# Patient Record
Sex: Female | Born: 1955
Health system: Southern US, Community
[De-identification: ages and names within clinical notes are randomized; demographics above are authoritative.]

## PROBLEM LIST (undated history)

## (undated) DIAGNOSIS — H269 Unspecified cataract: Secondary | ICD-10-CM

## (undated) DIAGNOSIS — F419 Anxiety disorder, unspecified: Secondary | ICD-10-CM

## (undated) DIAGNOSIS — R51 Headache: Secondary | ICD-10-CM

## (undated) DIAGNOSIS — L409 Psoriasis, unspecified: Secondary | ICD-10-CM

## (undated) DIAGNOSIS — R87629 Unspecified abnormal cytological findings in specimens from vagina: Secondary | ICD-10-CM

## (undated) DIAGNOSIS — M1990 Unspecified osteoarthritis, unspecified site: Secondary | ICD-10-CM

## (undated) DIAGNOSIS — E785 Hyperlipidemia, unspecified: Secondary | ICD-10-CM

## (undated) DIAGNOSIS — E7800 Pure hypercholesterolemia, unspecified: Secondary | ICD-10-CM

## (undated) DIAGNOSIS — C801 Malignant (primary) neoplasm, unspecified: Secondary | ICD-10-CM

## (undated) HISTORY — DX: Malignant (primary) neoplasm, unspecified: C80.1

## (undated) HISTORY — DX: Anxiety disorder, unspecified: F41.9

## (undated) HISTORY — DX: Psoriasis, unspecified: L40.9

## (undated) HISTORY — DX: Unspecified osteoarthritis, unspecified site: M19.90

## (undated) HISTORY — DX: Unspecified abnormal cytological findings in specimens from vagina: R87.629

## (undated) HISTORY — DX: Unspecified cataract: H26.9

## (undated) HISTORY — PX: ABDOMINAL HYSTERECTOMY: SHX81

---

## 2002-09-09 ENCOUNTER — Ambulatory Visit (HOSPITAL_COMMUNITY): Admission: RE | Admit: 2002-09-09 | Discharge: 2002-09-09 | Payer: Self-pay | Admitting: Internal Medicine

## 2002-09-09 ENCOUNTER — Encounter: Payer: Self-pay | Admitting: Obstetrics and Gynecology

## 2003-11-03 ENCOUNTER — Ambulatory Visit (HOSPITAL_COMMUNITY): Admission: RE | Admit: 2003-11-03 | Discharge: 2003-11-03 | Payer: Self-pay | Admitting: Family Medicine

## 2004-12-24 ENCOUNTER — Ambulatory Visit (HOSPITAL_COMMUNITY): Admission: RE | Admit: 2004-12-24 | Discharge: 2004-12-24 | Payer: Self-pay | Admitting: Family Medicine

## 2005-02-18 ENCOUNTER — Ambulatory Visit: Payer: Self-pay | Admitting: Internal Medicine

## 2005-02-18 ENCOUNTER — Encounter (INDEPENDENT_AMBULATORY_CARE_PROVIDER_SITE_OTHER): Payer: Self-pay | Admitting: Internal Medicine

## 2005-02-18 ENCOUNTER — Ambulatory Visit (HOSPITAL_COMMUNITY): Admission: RE | Admit: 2005-02-18 | Discharge: 2005-02-18 | Payer: Self-pay | Admitting: Internal Medicine

## 2005-10-03 ENCOUNTER — Ambulatory Visit (HOSPITAL_COMMUNITY): Admission: RE | Admit: 2005-10-03 | Discharge: 2005-10-03 | Payer: Self-pay | Admitting: Family Medicine

## 2006-03-13 ENCOUNTER — Ambulatory Visit (HOSPITAL_COMMUNITY): Admission: RE | Admit: 2006-03-13 | Discharge: 2006-03-13 | Payer: Self-pay | Admitting: Obstetrics & Gynecology

## 2007-10-11 ENCOUNTER — Ambulatory Visit (HOSPITAL_COMMUNITY): Admission: RE | Admit: 2007-10-11 | Discharge: 2007-10-11 | Payer: Self-pay | Admitting: Obstetrics & Gynecology

## 2007-12-08 ENCOUNTER — Emergency Department (HOSPITAL_COMMUNITY): Admission: EM | Admit: 2007-12-08 | Discharge: 2007-12-08 | Payer: Self-pay | Admitting: Emergency Medicine

## 2007-12-24 ENCOUNTER — Emergency Department (HOSPITAL_COMMUNITY): Admission: EM | Admit: 2007-12-24 | Discharge: 2007-12-24 | Payer: Self-pay | Admitting: Emergency Medicine

## 2008-12-23 ENCOUNTER — Ambulatory Visit (HOSPITAL_COMMUNITY): Admission: RE | Admit: 2008-12-23 | Discharge: 2008-12-23 | Payer: Self-pay | Admitting: Family Medicine

## 2010-03-16 ENCOUNTER — Ambulatory Visit (HOSPITAL_COMMUNITY): Admission: RE | Admit: 2010-03-16 | Discharge: 2010-03-16 | Payer: Self-pay | Admitting: Family Medicine

## 2010-05-16 ENCOUNTER — Encounter: Payer: Self-pay | Admitting: Obstetrics & Gynecology

## 2010-09-10 NOTE — Op Note (Signed)
[name], Joyce                ACCOUNT NO.:  [id number]   MEDICAL RECORD NO.:  [id number]          PATIENT TYPE:  AMB   LOCATION:  DAY                           FACILITY:  [facility]   PHYSICIAN:  Lionel December, M.D.    DATE OF BIRTH:  [birth year]   DATE OF PROCEDURE:  02/18/2005  DATE OF DISCHARGE:                                 OPERATIVE REPORT   PROCEDURE:  Colonoscopy.   INDICATIONS:  Joyce [name] is 55 year old Caucasian female who is here for  screening colonoscopy.  Family history is negative for colorectal carcinoma.   Procedures and risks were reviewed the patient, informed consent was  obtained.   PREOP MEDICATIONS:  Demerol 40 mg IV Versed 5 mg IV.   FINDINGS:  Procedure performed in endoscopy suite. The patient's vital signs  and O2 saturations were monitored during the procedure and remained stable.  The patient was placed left lateral position and rectal examination  performed. She had small central skin tags. Digital exam was normal. Olympus  videoscope was placed in rectum and advanced under vision into sigmoid colon  and beyond. Preparation was satisfactory. Scope was passed to the cecum  which was identified by appendiceal orifice and ileocecal valve. Pictures  taken for the record. A short segment of TI was also examined and was  normal.   As the scope was withdrawn colonic mucosa was carefully examined. There was  a small polyp in mid transverse colon which was ablated by cold biopsy.  Mucosa of the rest of the colon and rectum was normal. Scope was retroflexed  to examine anorectal junction; and she had small hemorrhoids below the  dentate line, and prominent anal papilla. Endoscope was straightened and  withdrawn. The patient tolerated the procedure well.   FINAL DIAGNOSIS:  1.  Small polyp ablated by cold biopsy from mid transverse colon.  2.  Small external hemorrhoids and prominent anal papilla.   RECOMMENDATIONS:  1.  Standard instructions given.  2.  I  will be contacting the patient with biopsy results and further      recommendations.      Lionel December, M.D.  Electronically Signed     NR/MEDQ  D:  02/18/2005  T:  02/18/2005  Job:  [id number]   cc:   Angus G. Renard Matter, MD  Fax: [phone]

## 2011-08-02 ENCOUNTER — Other Ambulatory Visit: Payer: Self-pay | Admitting: Obstetrics & Gynecology

## 2011-08-02 DIAGNOSIS — Z139 Encounter for screening, unspecified: Secondary | ICD-10-CM

## 2011-08-30 ENCOUNTER — Ambulatory Visit (HOSPITAL_COMMUNITY)
Admission: RE | Admit: 2011-08-30 | Discharge: 2011-08-30 | Disposition: A | Discharge status: Home / Self Care | Payer: BC Managed Care – PPO | Source: Ambulatory Visit | Attending: Obstetrics & Gynecology | Admitting: Obstetrics & Gynecology

## 2011-08-30 DIAGNOSIS — Z139 Encounter for screening, unspecified: Secondary | ICD-10-CM

## 2011-08-30 DIAGNOSIS — Z1231 Encounter for screening mammogram for malignant neoplasm of breast: Secondary | ICD-10-CM | POA: Insufficient documentation

## 2012-01-27 ENCOUNTER — Telehealth (INDEPENDENT_AMBULATORY_CARE_PROVIDER_SITE_OTHER): Payer: Self-pay | Admitting: *Deleted

## 2012-01-27 ENCOUNTER — Other Ambulatory Visit (INDEPENDENT_AMBULATORY_CARE_PROVIDER_SITE_OTHER): Payer: Self-pay | Admitting: *Deleted

## 2012-01-27 DIAGNOSIS — Z1211 Encounter for screening for malignant neoplasm of colon: Secondary | ICD-10-CM

## 2012-01-27 DIAGNOSIS — Z86010 Personal history of colonic polyps: Secondary | ICD-10-CM

## 2012-01-27 NOTE — Telephone Encounter (Signed)
Patient needs movi prep 

## 2012-01-30 MED ORDER — PEG-KCL-NACL-NASULF-NA ASC-C 100 G PO SOLR: 1.0000 | Freq: Once | ORAL | Status: DC

## 2012-03-14 ENCOUNTER — Telehealth (INDEPENDENT_AMBULATORY_CARE_PROVIDER_SITE_OTHER): Payer: Self-pay | Admitting: *Deleted

## 2012-03-14 NOTE — Telephone Encounter (Signed)
  Procedure: tcs  Reason/Indication:  Hx polyps  Has patient had this procedure before?  yes  If so, when, by whom and where?  2006  Is there a family history of colon cancer?  no  Who?  What age when diagnosed?    Is patient diabetic?   no      Does patient have prosthetic heart valve?  no  Do you have a pacemaker?  no  Has patient had joint replacement within last 12 months?  no  Is patient on Coumadin, Plavix and/or Aspirin? yes  Medications: asa 325 mg prn, celebrex 200 mg daily, crestor 20 mg daily, xanax 1 mg bid, otc sleep aid prn  Allergies: nkda  Medication Adjustment: asa 2 days  Procedure date & time: 04/05/12 at 930

## 2012-03-14 NOTE — Telephone Encounter (Signed)
agree

## 2012-03-26 ENCOUNTER — Encounter (HOSPITAL_COMMUNITY): Payer: Self-pay | Admitting: Pharmacy Technician

## 2012-04-05 ENCOUNTER — Ambulatory Visit (HOSPITAL_COMMUNITY)
Admission: RE | Admit: 2012-04-05 | Discharge: 2012-04-05 | Disposition: A | Discharge status: Home / Self Care | Payer: BC Managed Care – PPO | Source: Ambulatory Visit | Attending: Internal Medicine | Admitting: Internal Medicine

## 2012-04-05 ENCOUNTER — Encounter (HOSPITAL_COMMUNITY): Payer: Self-pay | Admitting: *Deleted

## 2012-04-05 ENCOUNTER — Encounter (HOSPITAL_COMMUNITY)
Admission: RE | Disposition: A | Discharge status: Home / Self Care | Payer: Self-pay | Source: Ambulatory Visit | Attending: Internal Medicine

## 2012-04-05 DIAGNOSIS — K644 Residual hemorrhoidal skin tags: Secondary | ICD-10-CM

## 2012-04-05 DIAGNOSIS — Z86010 Personal history of colonic polyps: Secondary | ICD-10-CM

## 2012-04-05 DIAGNOSIS — D126 Benign neoplasm of colon, unspecified: Secondary | ICD-10-CM | POA: Insufficient documentation

## 2012-04-05 DIAGNOSIS — D128 Benign neoplasm of rectum: Secondary | ICD-10-CM

## 2012-04-05 DIAGNOSIS — K6389 Other specified diseases of intestine: Secondary | ICD-10-CM

## 2012-04-05 DIAGNOSIS — D129 Benign neoplasm of anus and anal canal: Secondary | ICD-10-CM

## 2012-04-05 HISTORY — DX: Pure hypercholesterolemia, unspecified: E78.00

## 2012-04-05 HISTORY — DX: Headache: R51

## 2012-04-05 HISTORY — PX: COLONOSCOPY: SHX5424

## 2012-04-05 SURGERY — COLONOSCOPY
Anesthesia: Moderate Sedation

## 2012-04-05 MED ORDER — SODIUM CHLORIDE 0.45 % IV SOLN
INTRAVENOUS | Status: DC
  Administered 2012-04-05: 09:00:00 via INTRAVENOUS

## 2012-04-05 MED ORDER — MIDAZOLAM HCL 5 MG/5ML IJ SOLN
INTRAMUSCULAR | Status: DC | PRN
  Administered 2012-04-05 (×3): 2 mg via INTRAVENOUS

## 2012-04-05 MED ORDER — MEPERIDINE HCL 50 MG/ML IJ SOLN
INTRAMUSCULAR | Status: DC | PRN
  Administered 2012-04-05: 25 mg via INTRAVENOUS

## 2012-04-05 MED ORDER — MEPERIDINE HCL 50 MG/ML IJ SOLN
INTRAMUSCULAR | Status: AC
  Filled 2012-04-05: qty 1

## 2012-04-05 MED ORDER — MIDAZOLAM HCL 5 MG/5ML IJ SOLN
INTRAMUSCULAR | Status: AC
  Filled 2012-04-05: qty 10

## 2012-04-05 MED ORDER — STERILE WATER FOR IRRIGATION IR SOLN
Status: DC | PRN
  Administered 2012-04-05: 09:00:00

## 2012-04-05 MED ORDER — NYSTATIN-TRIAMCINOLONE 100000-0.1 UNIT/GM-% EX OINT: TOPICAL_OINTMENT | Freq: Two times a day (BID) | CUTANEOUS | Status: DC

## 2012-04-05 NOTE — H&P (Signed)
Joyce [name] is an 56 y.o. female.   Chief Complaint: Patient is here for colonoscopy. HPI: Patient is 56 year old Caucasian female with history of colonic adenoma and is in for surveillance examination. Her last exam was in October 2006 with a single small tubular adenoma and she was advised to return in 7 years instead of 5. She is feeling fine. She denies abdominal pain change in her bowel habits or rectal bleeding. Family history is negative for CRC to  Past Medical History  Diagnosis Date  . Hypercholesteremia   . Headache     Past Surgical History  Procedure Date  . Abdominal hysterectomy     Family History  Problem Relation Age of Onset  . Colon cancer Neg Hx    Social History:  reports that she has been smoking.  She does not have any smokeless tobacco history on file. She reports that she does not drink alcohol or use illicit drugs.  Allergies: No Known Allergies  Medications Prior to Admission  Medication Sig Dispense Refill  . ALPRAZolam (XANAX) 1 MG tablet Take 1 mg by mouth daily.      Marland Kitchen aspirin EC 81 MG tablet Take 81 mg by mouth daily.      . celecoxib (CELEBREX) 200 MG capsule Take 200 mg by mouth daily.      . peg 3350 powder (MOVIPREP) 100 G SOLR Take 1 kit (100 g total) by mouth once.  1 kit  0  . rosuvastatin (CRESTOR) 20 MG tablet Take 20 mg by mouth daily.        No results found for this or any previous visit (from the past 48 hour(s)). No results found.  ROS  Blood pressure 117/81, pulse 74, temperature 97.7 F (36.5 C), temperature source Oral, resp. rate 18, height 5' 2.5" (1.588 m), weight 138 lb (62.596 kg), SpO2 97.00%. Physical Exam  Constitutional: She appears well-developed and well-nourished.  HENT:  Mouth/Throat: Oropharynx is clear and moist.  Eyes: Conjunctivae normal are normal. No scleral icterus.  Neck: No thyromegaly present.  Cardiovascular: Normal rate, regular rhythm and normal heart sounds.   No murmur heard. Respiratory:  Effort normal and breath sounds normal.  GI: Soft. She exhibits no distension. There is no tenderness.  Musculoskeletal: She exhibits no edema.  Lymphadenopathy:    She has no cervical adenopathy.  Neurological: She is alert.  Skin: Skin is warm and dry.     Assessment/Plan History of colonic adenoma. Surveillance colonoscopy.   Joyce [name],Joyce [name] 04/05/2012, 9:28 AM

## 2012-04-05 NOTE — OR Nursing (Signed)
Gave 25mg  of demerol at 0952 by t Amato Sevillano

## 2012-04-05 NOTE — Op Note (Signed)
COLONOSCOPY PROCEDURE REPORT  PATIENT:  Joyce [name] [facility]  MR#:  [id number] Birthdate:  [birth year], 56 y.o., female Endoscopist:  Dr. Malissa Hippo, MD Referred By:  Dr. Alice Reichert, MD Procedure Date: 04/05/2012  Procedure:   Colonoscopy with snare polypectomy.  Indications: Patient is 56 years old Caucasian female with colonic adenoma removed 7 years ago. She is here for surveillance examination. She does not have any GI symptoms other than intermittent constipation.  Informed Consent:  The procedure and risks were reviewed with the patient and informed consent was obtained.  Medications:  Demerol 50 mg IV Versed 6 mg IV  Description of procedure:  After a digital rectal exam was performed, that colonoscope was advanced from the anus through the rectum and colon to the area of the cecum, ileocecal valve and appendiceal orifice. The cecum was deeply intubated. These structures were well-seen and photographed for the record. From the level of the cecum and ileocecal valve, the scope was slowly and cautiously withdrawn. The mucosal surfaces were carefully surveyed utilizing scope tip to flexion to facilitate fold flattening as needed. The scope was pulled down into the rectum where a thorough exam including retroflexion was performed.  Findings:   Prep satisfactory. 2 small polyps ablated via cold biopsy and submitted together. These are located at cecum and hepatic flexure. 10 mm worm like sessile polyp it from mid transverse colon. Another small polyp ablated via cold biopsy from rectum. Hemorrhoids below the dentate line along with 2 anal papillae.  Therapeutic/Diagnostic Maneuvers Performed:  See above  Complications:  None  Cecal Withdrawal Time:  23 minutes  Impression:  Examination performed to cecum. 2 small polyps are ablated via cold biopsy and submitted together(cecum and hepatic flexure). 10 mm sessile wormlike polyp snared from transverse colon. Residual polyp  coagulated with snare tip. Small rectal polyp ablated via cold biopsy. External hemorrhoids and anal papillae.   Recommendations:  Standard instructions given. Mycolog-II cream to be applied to anal canal twice a day for 2 weeks. I will contact patient with biopsy results.  REHMAN,NAJEEB U  04/05/2012 10:18 AM  CC: Dr. Alice Reichert, MD & Dr. Bonnetta Barry ref. provider found

## 2012-04-09 ENCOUNTER — Encounter (HOSPITAL_COMMUNITY): Payer: Self-pay | Admitting: Internal Medicine

## 2012-04-09 ENCOUNTER — Encounter (INDEPENDENT_AMBULATORY_CARE_PROVIDER_SITE_OTHER): Payer: Self-pay | Admitting: *Deleted

## 2012-11-19 ENCOUNTER — Other Ambulatory Visit: Payer: Self-pay | Admitting: Obstetrics & Gynecology

## 2012-11-19 DIAGNOSIS — Z139 Encounter for screening, unspecified: Secondary | ICD-10-CM

## 2012-12-10 ENCOUNTER — Ambulatory Visit (HOSPITAL_COMMUNITY)
Admission: RE | Admit: 2012-12-10 | Discharge: 2012-12-10 | Disposition: A | Discharge status: Home / Self Care | Payer: BC Managed Care – PPO | Source: Ambulatory Visit | Attending: Obstetrics & Gynecology | Admitting: Obstetrics & Gynecology

## 2012-12-10 ENCOUNTER — Ambulatory Visit (HOSPITAL_COMMUNITY): Payer: BC Managed Care – PPO

## 2012-12-10 DIAGNOSIS — Z1231 Encounter for screening mammogram for malignant neoplasm of breast: Secondary | ICD-10-CM | POA: Insufficient documentation

## 2012-12-10 DIAGNOSIS — Z139 Encounter for screening, unspecified: Secondary | ICD-10-CM

## 2012-12-12 ENCOUNTER — Ambulatory Visit (INDEPENDENT_AMBULATORY_CARE_PROVIDER_SITE_OTHER): Payer: BC Managed Care – PPO | Admitting: Obstetrics & Gynecology

## 2012-12-12 ENCOUNTER — Other Ambulatory Visit (HOSPITAL_COMMUNITY)
Admission: RE | Admit: 2012-12-12 | Discharge: 2012-12-12 | Disposition: A | Discharge status: Home / Self Care | Payer: BC Managed Care – PPO | Source: Ambulatory Visit | Attending: Obstetrics & Gynecology | Admitting: Obstetrics & Gynecology

## 2012-12-12 ENCOUNTER — Encounter: Payer: Self-pay | Admitting: Obstetrics & Gynecology

## 2012-12-12 DIAGNOSIS — Z01419 Encounter for gynecological examination (general) (routine) without abnormal findings: Secondary | ICD-10-CM

## 2012-12-12 DIAGNOSIS — E7800 Pure hypercholesterolemia, unspecified: Secondary | ICD-10-CM

## 2012-12-12 DIAGNOSIS — F419 Anxiety disorder, unspecified: Secondary | ICD-10-CM

## 2012-12-12 DIAGNOSIS — Z1151 Encounter for screening for human papillomavirus (HPV): Secondary | ICD-10-CM | POA: Insufficient documentation

## 2012-12-12 DIAGNOSIS — Z1212 Encounter for screening for malignant neoplasm of rectum: Secondary | ICD-10-CM

## 2012-12-12 NOTE — Addendum Note (Signed)
Addended by: Criss Alvine on: 12/12/2012 04:20 PM   Modules accepted: Orders

## 2012-12-12 NOTE — Progress Notes (Signed)
Patient ID: Aleyda Gindlesperger [facility], female   DOB: Oct 24, 1955, 57 y.o.   MRN: [id number] Subjective:     Joyce [name] is a 57 y.o. female here for a routine exam.  No LMP recorded. Patient has had a hysterectomy.was done because of cancer No obstetric history on file. Current complaints: none.  Personal health questionnaire reviewed: yes.   Gynecologic History No LMP recorded. Patient has had a hysterectomy. Contraception: status post hysterectomy Last Pap: 2013. Results were: normal Last mammogram: 2014. Results were: normal  Obstetric History OB History  No data available    Past Surgical History  Procedure Laterality Date  . Abdominal hysterectomy    . Colonoscopy  04/05/2012    Procedure: COLONOSCOPY;  Surgeon: Malissa Hippo, MD;  Location: [facility];  Service: Endoscopy;  Laterality: N/A;  930    The following portions of the patient's history were reviewed and updated as appropriate: allergies, current medications, past family history, past medical history, past social history, past surgical history and problem list.  Review of Systems  Review of Systems  Constitutional: Negative for fever, chills, weight loss, malaise/fatigue and diaphoresis.  HENT: Negative for hearing loss, ear pain, nosebleeds, congestion, sore throat, neck pain, tinnitus and ear discharge.   Eyes: Negative for blurred vision, double vision, photophobia, pain, discharge and redness.  Respiratory: Negative for cough, hemoptysis, sputum production, shortness of breath, wheezing and stridor.   Cardiovascular: Negative for chest pain, palpitations, orthopnea, claudication, leg swelling and PND.  Gastrointestinal: negative for abdominal pain. Negative for heartburn, nausea, vomiting, diarrhea, constipation, blood in stool and melena.  Genitourinary: Negative for dysuria, urgency, frequency, hematuria and flank pain.  Musculoskeletal: Negative for myalgias, back pain, joint pain and falls.  Skin: Negative for  itching and rash.  Neurological: Negative for dizziness, tingling, tremors, sensory change, speech change, focal weakness, seizures, loss of consciousness, weakness and headaches.  Endo/Heme/Allergies: Negative for environmental allergies and polydipsia. Does not bruise/bleed easily.  Psychiatric/Behavioral: Negative for depression, suicidal ideas, hallucinations, memory loss and substance abuse. The patient is not nervous/anxious and does not have insomnia.        Objective:    Physical Exam  Vitals reviewed. Constitutional: She is oriented to person, place, and time. She appears well-developed and well-nourished.  HENT:  Head: Normocephalic and atraumatic.        Right Ear: External ear normal.  Left Ear: External ear normal.  Nose: Nose normal.  Mouth/Throat: Oropharynx is clear and moist.  Eyes: Conjunctivae and EOM are normal. Pupils are equal, round, and reactive to light. Right eye exhibits no discharge. Left eye exhibits no discharge. No scleral icterus.  Neck: Normal range of motion. Neck supple. No tracheal deviation present. No thyromegaly present.  Cardiovascular: Normal rate, regular rhythm, normal heart sounds and intact distal pulses.  Exam reveals no gallop and no friction rub.   No murmur heard. Respiratory: Effort normal and breath sounds normal. No respiratory distress. She has no wheezes. She has no rales. She exhibits no tenderness.  Breasts no masses discharge or skin changes GI: Soft. Bowel sounds are normal. She exhibits no distension and no mass. There is no tenderness. There is no rebound and no guarding.  Genitourinary:       Vulva is normal without lesions Vagina is pink moist without discharge Cervix normal in appearance and pap is done Uterus is normal size shape and contour Adnexa is negative with normal sized ovaries Rectal heme occult negative no masses normal tone  Musculoskeletal: Normal range of motion. She exhibits no edema and no tenderness.   Neurological: She is alert and oriented to person, place, and time. She has normal reflexes. She displays normal reflexes. No cranial nerve deficit. She exhibits normal muscle tone. Coordination normal.  Skin: Skin is warm and dry. No rash noted. No erythema. No pallor.  Psychiatric: She has a normal mood and affect. Her behavior is normal. Judgment and thought content normal.       Assessment:    Healthy female exam.    Plan:    Mammogram ordered. Follow up in: 1 year.

## 2012-12-13 ENCOUNTER — Telehealth: Payer: Self-pay | Admitting: Obstetrics and Gynecology

## 2013-09-27 ENCOUNTER — Encounter: Payer: Self-pay | Admitting: Family Medicine

## 2013-09-27 ENCOUNTER — Encounter (INDEPENDENT_AMBULATORY_CARE_PROVIDER_SITE_OTHER): Payer: Self-pay

## 2013-09-27 ENCOUNTER — Ambulatory Visit (INDEPENDENT_AMBULATORY_CARE_PROVIDER_SITE_OTHER): Payer: BC Managed Care – PPO | Admitting: Family Medicine

## 2013-09-27 VITALS — BP 134/90 | HR 84 | Temp 98.2°F | Ht 61.0 in | Wt 151.0 lb

## 2013-09-27 DIAGNOSIS — H60399 Other infective otitis externa, unspecified ear: Secondary | ICD-10-CM

## 2013-09-27 MED ORDER — NEOMYCIN-POLYMYXIN-HC 3.5-10000-1 OT SOLN: 3.0000 [drp] | Freq: Four times a day (QID) | OTIC | Status: DC

## 2013-09-27 NOTE — Progress Notes (Signed)
   Subjective:    Patient ID: Joyce [name] [facility], female    DOB: 1955/08/29, 58 y.o.   MRN: [id number]  HPI This 58 y.o. female presents for evaluation of right ear discomfort for a week.  She was sent over from her employer for evaluation.  She has decreased hearing acuity in her right ear.   Review of Systems C/o right ear discomfort   No chest pain, SOB, HA, dizziness, vision change, N/V, diarrhea, constipation, dysuria, urinary urgency or frequency, myalgias, arthralgias or rash.  Objective:   Physical Exam  Vital signs noted  Well developed well nourished female.  HEENT - Head atraumatic Normocephalic                Eyes - PERRLA, Conjuctiva - clear Sclera- Clear EOMI                Ears - EAC right side impacted and after irrigation EAC's Wnl TM's Wnl Gross Hearing WNL                Nose - Nares patent                 Throat - oropharanx wnl Respiratory - Lungs CTA bilateral Cardiac - RRR S1 and S2 without murmur GI - Abdomen soft Nontender and bowel sounds active x 4 Extremities - No edema. Neuro - Grossly intact.      Assessment & Plan:  Otitis, externa, infective - Plan: neomycin-polymyxin-hydrocortisone (CORTISPORIN) otic solution  Cerumen impaction - Ears irrigated to clear Ok to return back to work.

## 2013-10-03 IMAGING — MG MM DIGITAL SCREENING
4 series · 4 of 4 positions shown · non-contrast
Comparison: Previous exam(s)

ACR Breast Density Category a: The breast tissue is almost entirely
fatty.

CLINICAL DATA: Screening.

EXAM:
DIGITAL SCREENING BILATERAL MAMMOGRAM WITH CAD

[L CC]
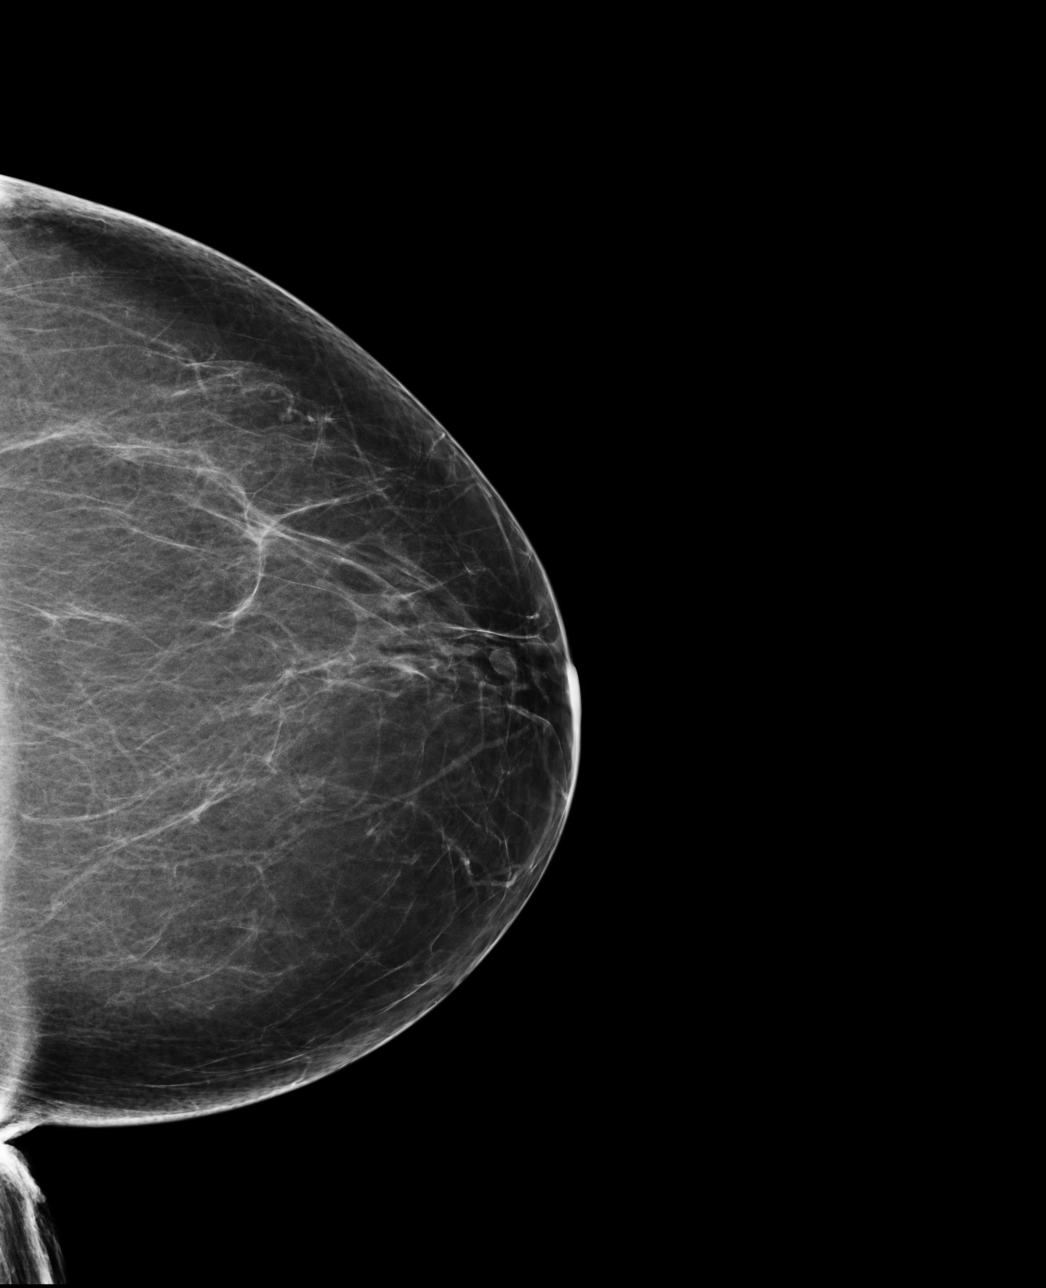

[L MLO]
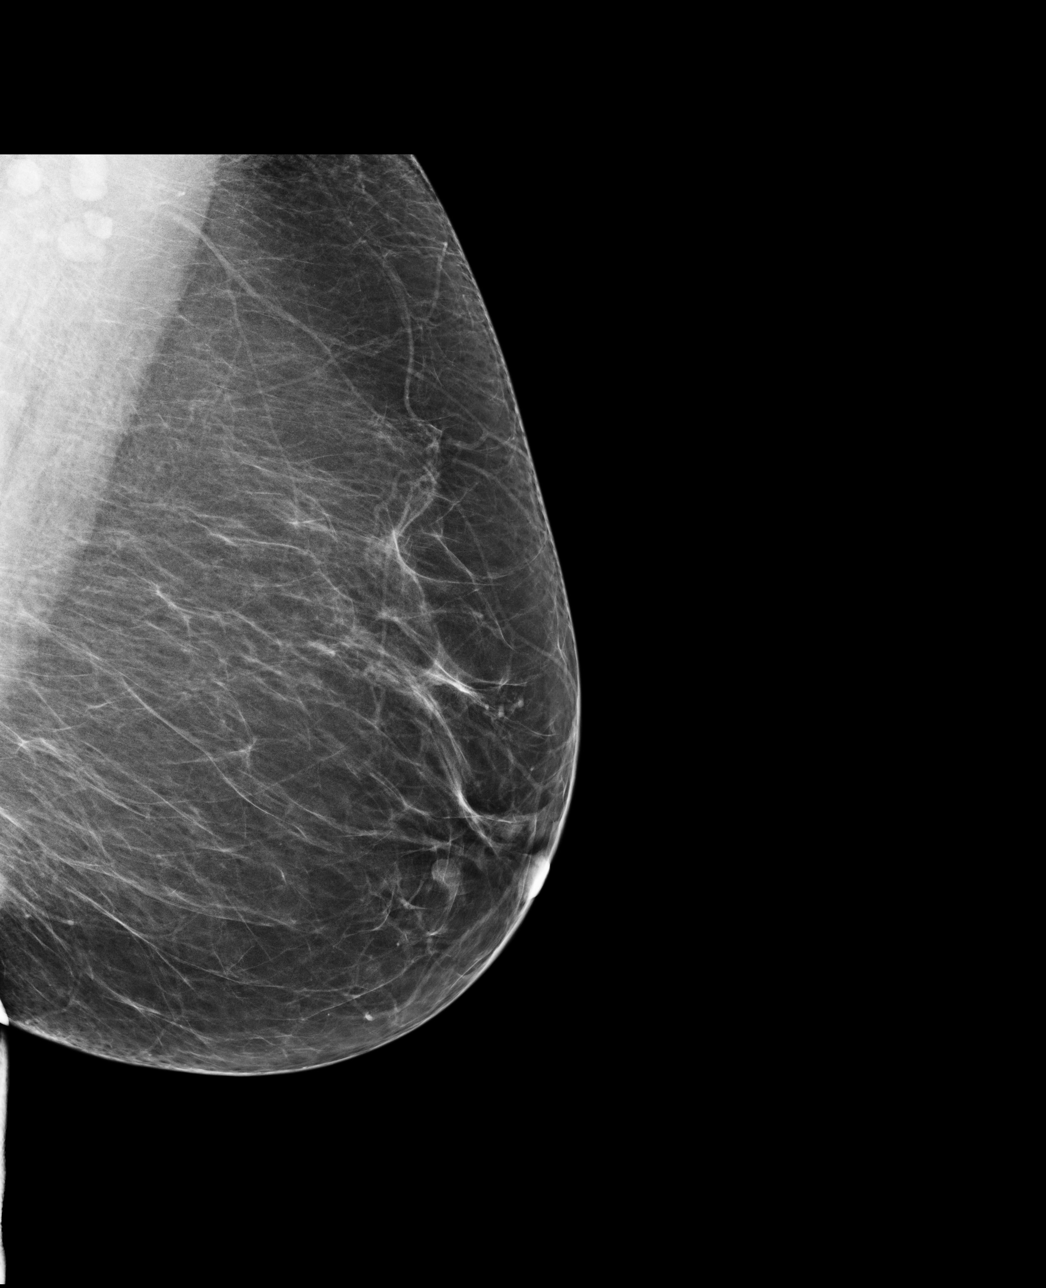

[R CC]
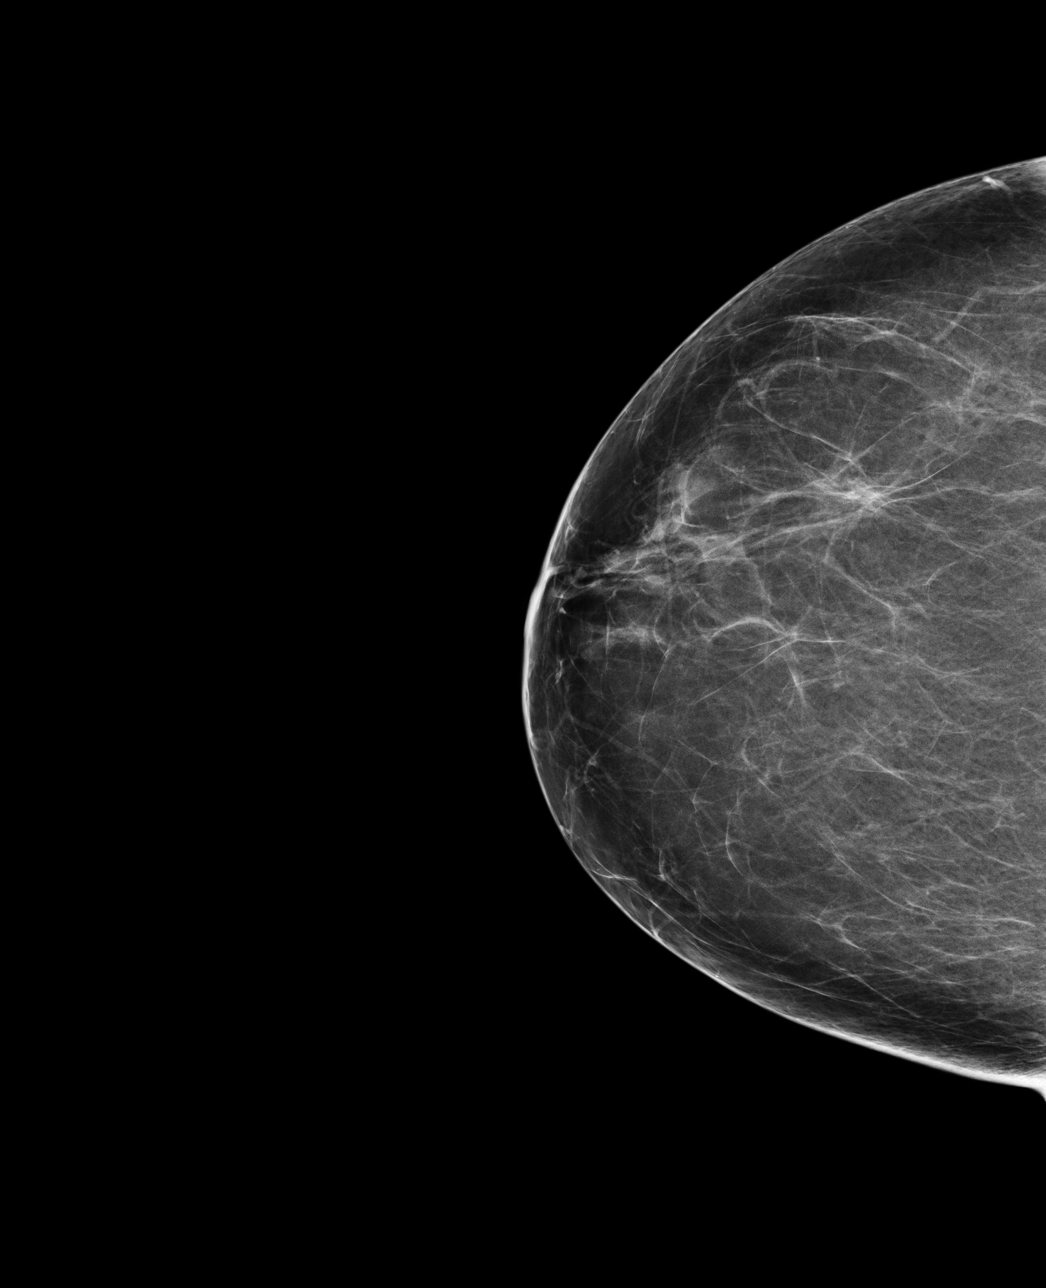

[R MLO]
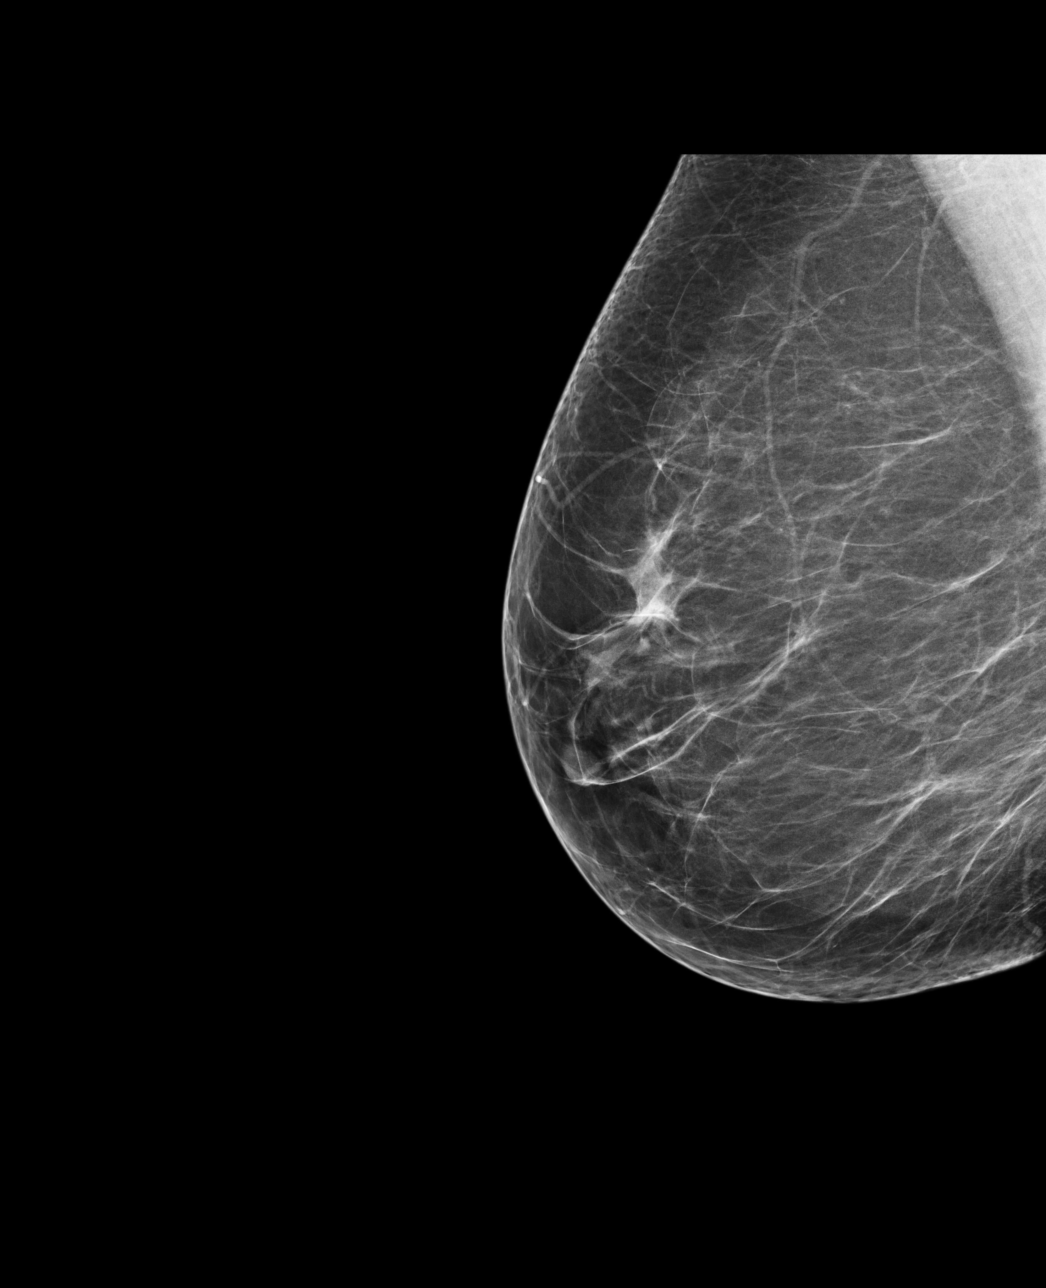

[4 of 4 positions shown; findings below may reference images not displayed]

FINDINGS: There are no findings suspicious for malignancy. Images were
processed with CAD.
IMPRESSION: No mammographic evidence of malignancy. A result letter of this
screening mammogram will be mailed directly to the patient.

BI-RADS CATEGORY  1. Negative

RECOMMENDATION:
Screening mammogram in one year. (Code:GD-M-W2P)

## 2014-01-23 ENCOUNTER — Other Ambulatory Visit: Payer: Self-pay | Admitting: Obstetrics & Gynecology

## 2014-01-23 DIAGNOSIS — Z1239 Encounter for other screening for malignant neoplasm of breast: Secondary | ICD-10-CM

## 2014-01-28 ENCOUNTER — Other Ambulatory Visit: Payer: Self-pay | Admitting: Obstetrics & Gynecology

## 2014-01-28 DIAGNOSIS — Z1231 Encounter for screening mammogram for malignant neoplasm of breast: Secondary | ICD-10-CM

## 2014-02-24 ENCOUNTER — Other Ambulatory Visit (HOSPITAL_COMMUNITY)
Admission: RE | Admit: 2014-02-24 | Discharge: 2014-02-24 | Disposition: A | Discharge status: Home / Self Care | Payer: BC Managed Care – PPO | Source: Ambulatory Visit | Attending: Obstetrics & Gynecology | Admitting: Obstetrics & Gynecology

## 2014-02-24 ENCOUNTER — Encounter: Payer: Self-pay | Admitting: Obstetrics & Gynecology

## 2014-02-24 ENCOUNTER — Ambulatory Visit (INDEPENDENT_AMBULATORY_CARE_PROVIDER_SITE_OTHER): Payer: BC Managed Care – PPO | Admitting: Obstetrics & Gynecology

## 2014-02-24 ENCOUNTER — Ambulatory Visit (HOSPITAL_COMMUNITY)
Admission: RE | Admit: 2014-02-24 | Discharge: 2014-02-24 | Disposition: A | Discharge status: Home / Self Care | Payer: BC Managed Care – PPO | Source: Ambulatory Visit | Attending: Obstetrics & Gynecology | Admitting: Obstetrics & Gynecology

## 2014-02-24 VITALS — BP 120/90 | Ht 62.0 in | Wt 150.0 lb

## 2014-02-24 DIAGNOSIS — Z01419 Encounter for gynecological examination (general) (routine) without abnormal findings: Secondary | ICD-10-CM | POA: Insufficient documentation

## 2014-02-24 DIAGNOSIS — Z1211 Encounter for screening for malignant neoplasm of colon: Secondary | ICD-10-CM

## 2014-02-24 DIAGNOSIS — Z1231 Encounter for screening mammogram for malignant neoplasm of breast: Secondary | ICD-10-CM | POA: Diagnosis present

## 2014-02-24 DIAGNOSIS — Z1212 Encounter for screening for malignant neoplasm of rectum: Secondary | ICD-10-CM

## 2014-02-24 NOTE — Progress Notes (Signed)
Patient ID: Laiyah Exline [facility], female   DOB: 05/08/1955, 58 y.o.   MRN: [id number] Subjective:     GENESIA CASLIN is a 58 y.o. female here for a routine exam.  No LMP recorded. Patient has had a hysterectomy. No obstetric history on file. Birth Control Method:  na Menstrual Calendar(currently): na  Current complaints: some bladder symptoms.   Current acute medical issues:  none   Recent Gynecologic History No LMP recorded. Patient has had a hysterectomy.  Uterine cancer Last Pap: 2014,  normal Last mammogram: today,  pending  Past Medical History  Diagnosis Date  . Hypercholesteremia   . YIFOYDXA(128.7)     Past Surgical History  Procedure Laterality Date  . Abdominal hysterectomy    . Colonoscopy  04/05/2012    Procedure: COLONOSCOPY;  Surgeon: Rogene Houston, MD;  Location: [facility];  Service: Endoscopy;  Laterality: N/A;  930    OB History    No data available      History   Social History  . Marital Status: Single    Spouse Name: N/A    Number of Children: N/A  . Years of Education: N/A   Social History Main Topics  . Smoking status: Current Every Day Smoker -- 1.00 packs/day for 44 years  . Smokeless tobacco: None  . Alcohol Use: No  . Drug Use: No  . Sexual Activity: Yes   Other Topics Concern  . None   Social History Narrative    Family History  Problem Relation Age of Onset  . Colon cancer Neg Hx   . Ovarian cancer Mother   . Bladder Cancer Father   . Lung cancer Sister     Current outpatient prescriptions: ALPRAZolam (XANAX) 1 MG tablet, Take 1 mg by mouth daily., Disp: , Rfl: ;  celecoxib (CELEBREX) 200 MG capsule, Take 200 mg by mouth daily., Disp: , Rfl: ;  neomycin-polymyxin-hydrocortisone (CORTISPORIN) otic solution, Place 3 drops into the right ear 4 (four) times daily., Disp: 10 mL, Rfl: 0;  rosuvastatin (CRESTOR) 20 MG tablet, Take 20 mg by mouth daily., Disp: , Rfl:   Review of Systems  Review of Systems  Constitutional: Negative  for fever, chills, weight loss, malaise/fatigue and diaphoresis.  HENT: Negative for hearing loss, ear pain, nosebleeds, congestion, sore throat, neck pain, tinnitus and ear discharge.   Eyes: Negative for blurred vision, double vision, photophobia, pain, discharge and redness.  Respiratory: Negative for cough, hemoptysis, sputum production, shortness of breath, wheezing and stridor.   Cardiovascular: Negative for chest pain, palpitations, orthopnea, claudication, leg swelling and PND.  Gastrointestinal: negative for abdominal pain. Negative for heartburn, nausea, vomiting, diarrhea, constipation, blood in stool and melena.  Genitourinary: Negative for dysuria, urgency, frequency, hematuria and flank pain.  Musculoskeletal: Negative for myalgias, back pain, joint pain and falls.  Skin: Negative for itching and rash.  Neurological: Negative for dizziness, tingling, tremors, sensory change, speech change, focal weakness, seizures, loss of consciousness, weakness and headaches.  Endo/Heme/Allergies: Negative for environmental allergies and polydipsia. Does not bruise/bleed easily.  Psychiatric/Behavioral: Negative for depression, suicidal ideas, hallucinations, memory loss and substance abuse. The patient is not nervous/anxious and does not have insomnia.        Objective:  Blood pressure 120/90, height 5\' 2"  (1.575 m), weight 150 lb (68.04 kg).   Physical Exam  Vitals reviewed. Constitutional: She is oriented to person, place, and time. She appears well-developed and well-nourished.  HENT:  Head: Normocephalic and atraumatic.  Right Ear: External ear normal.  Left Ear: External ear normal.  Nose: Nose normal.  Mouth/Throat: Oropharynx is clear and moist.  Eyes: Conjunctivae and EOM are normal. Pupils are equal, round, and reactive to light. Right eye exhibits no discharge. Left eye exhibits no discharge. No scleral icterus.  Neck: Normal range of motion. Neck supple. No tracheal  deviation present. No thyromegaly present.  Cardiovascular: Normal rate, regular rhythm, normal heart sounds and intact distal pulses.  Exam reveals no gallop and no friction rub.   No murmur heard. Respiratory: Effort normal and breath sounds normal. No respiratory distress. She has no wheezes. She has no rales. She exhibits no tenderness.  GI: Soft. Bowel sounds are normal. She exhibits no distension and no mass. There is no tenderness. There is no rebound and no guarding.  Genitourinary:  Breasts no masses skin changes or nipple changes bilaterally      Vulva is normal without lesions Vagina is pink moist without discharge Cervix absent  Pap done Uterus is absent Adnexa is negative  Rectal    hemoccult negative, normal tone, no masses  Musculoskeletal: Normal range of motion. She exhibits no edema and no tenderness.  Neurological: She is alert and oriented to person, place, and time. She has normal reflexes. She displays normal reflexes. No cranial nerve deficit. She exhibits normal muscle tone. Coordination normal.  Skin: Skin is warm and dry. No rash noted. No erythema. No pallor.  Psychiatric: She has a normal mood and affect. Her behavior is normal. Judgment and thought content normal.       Assessment:    Healthy female exam.    Plan:    Mammogram ordered. Follow up in: 1 year.

## 2014-02-27 LAB — CYTOLOGY - PAP

## 2014-08-15 ENCOUNTER — Ambulatory Visit (HOSPITAL_COMMUNITY)
Admission: RE | Admit: 2014-08-15 | Discharge: 2014-08-15 | Disposition: A | Discharge status: Home / Self Care | Payer: BLUE CROSS/BLUE SHIELD | Source: Ambulatory Visit | Attending: Family Medicine | Admitting: Family Medicine

## 2014-08-15 ENCOUNTER — Other Ambulatory Visit (HOSPITAL_COMMUNITY): Payer: Self-pay | Admitting: Family Medicine

## 2014-08-15 DIAGNOSIS — M542 Cervicalgia: Secondary | ICD-10-CM

## 2015-03-30 ENCOUNTER — Other Ambulatory Visit: Payer: Self-pay | Admitting: Obstetrics & Gynecology

## 2015-03-30 DIAGNOSIS — Z1231 Encounter for screening mammogram for malignant neoplasm of breast: Secondary | ICD-10-CM

## 2015-04-16 ENCOUNTER — Ambulatory Visit (HOSPITAL_COMMUNITY)
Admission: RE | Admit: 2015-04-16 | Discharge: 2015-04-16 | Disposition: A | Discharge status: Home / Self Care | Payer: BLUE CROSS/BLUE SHIELD | Source: Ambulatory Visit | Attending: Obstetrics & Gynecology | Admitting: Obstetrics & Gynecology

## 2015-04-16 ENCOUNTER — Other Ambulatory Visit (HOSPITAL_COMMUNITY)
Admission: RE | Admit: 2015-04-16 | Discharge: 2015-04-16 | Disposition: A | Discharge status: Home / Self Care | Payer: BLUE CROSS/BLUE SHIELD | Source: Ambulatory Visit | Attending: Obstetrics & Gynecology | Admitting: Obstetrics & Gynecology

## 2015-04-16 ENCOUNTER — Encounter: Payer: Self-pay | Admitting: Obstetrics & Gynecology

## 2015-04-16 ENCOUNTER — Ambulatory Visit (INDEPENDENT_AMBULATORY_CARE_PROVIDER_SITE_OTHER): Payer: BLUE CROSS/BLUE SHIELD | Admitting: Obstetrics & Gynecology

## 2015-04-16 VITALS — BP 140/90 | HR 76 | Ht 62.0 in | Wt 167.3 lb

## 2015-04-16 DIAGNOSIS — Z1212 Encounter for screening for malignant neoplasm of rectum: Secondary | ICD-10-CM

## 2015-04-16 DIAGNOSIS — Z01419 Encounter for gynecological examination (general) (routine) without abnormal findings: Secondary | ICD-10-CM | POA: Diagnosis not present

## 2015-04-16 DIAGNOSIS — Z1231 Encounter for screening mammogram for malignant neoplasm of breast: Secondary | ICD-10-CM | POA: Diagnosis present

## 2015-04-16 DIAGNOSIS — Z1211 Encounter for screening for malignant neoplasm of colon: Secondary | ICD-10-CM

## 2015-04-16 NOTE — Addendum Note (Signed)
Addended by: Diona Fanti A on: 04/16/2015 09:51 AM   Modules accepted: Orders

## 2015-04-16 NOTE — Progress Notes (Signed)
Patient ID: Arthurine Ambrosio [facility], female   DOB: 1955-07-22, 59 y.o.   MRN: [id number] Subjective:     Joyce [name] is a 59 y.o. female here for a routine exam.  No LMP recorded. Patient has had a hysterectomy. No obstetric history on file. Birth Control Method:  hysterectomy Menstrual Calendar(currently): hysterectomy  Current complaints: none weight from smoking cessation.   Current acute medical issues:  none   Recent Gynecologic History No LMP recorded. Patient has had a hysterectomy. Last Pap: 2015,  normal Last mammogram: today,  normal  Past Medical History  Diagnosis Date  . Hypercholesteremia   . Headache(784.0)   . Cancer [facility])     Past Surgical History  Procedure Laterality Date  . Abdominal hysterectomy    . Colonoscopy  04/05/2012    Procedure: COLONOSCOPY;  Surgeon: Rogene Houston, MD;  Location[facility];  Service: Endoscopy;  Laterality: N/A;  930    OB History    No data available      Social History   Social History  . Marital Status: Single    Spouse Name: N/A  . Number of Children: N/A  . Years of Education: N/A   Social History Main Topics  . Smoking status: Former Smoker -- 1.00 packs/day for 44 years  . Smokeless tobacco: None  . Alcohol Use: No  . Drug Use: No  . Sexual Activity: Yes   Other Topics Concern  . None   Social History Narrative    Family History  Problem Relation Age of Onset  . Colon cancer Neg Hx   . Ovarian cancer Mother   . Bladder Cancer Father   . Lung cancer Sister      Current outpatient prescriptions:  .  ALPRAZolam (XANAX) 1 MG tablet, Take 1 mg by mouth daily., Disp: , Rfl:  .  celecoxib (CELEBREX) 200 MG capsule, Take 200 mg by mouth daily., Disp: , Rfl:  .  neomycin-polymyxin-hydrocortisone (CORTISPORIN) otic solution, Place 3 drops into the right ear 4 (four) times daily., Disp: 10 mL, Rfl: 0 .  rosuvastatin (CRESTOR) 20 MG tablet, Take 20 mg by mouth daily., Disp: , Rfl:   Review of  Systems  Review of Systems  Constitutional: Negative for fever, chills, weight loss, malaise/fatigue and diaphoresis.  HENT: Negative for hearing loss, ear pain, nosebleeds, congestion, sore throat, neck pain, tinnitus and ear discharge.   Eyes: Negative for blurred vision, double vision, photophobia, pain, discharge and redness.  Respiratory: Negative for cough, hemoptysis, sputum production, shortness of breath, wheezing and stridor.   Cardiovascular: Negative for chest pain, palpitations, orthopnea, claudication, leg swelling and PND.  Gastrointestinal: negative for abdominal pain. Negative for heartburn, nausea, vomiting, diarrhea, constipation, blood in stool and melena.  Genitourinary: Negative for dysuria, urgency, frequency, hematuria and flank pain.  Musculoskeletal: Negative for myalgias, back pain, joint pain and falls.  Skin: Negative for itching and rash.  Neurological: Negative for dizziness, tingling, tremors, sensory change, speech change, focal weakness, seizures, loss of consciousness, weakness and headaches.  Endo/Heme/Allergies: Negative for environmental allergies and polydipsia. Does not bruise/bleed easily.  Psychiatric/Behavioral: Negative for depression, suicidal ideas, hallucinations, memory loss and substance abuse. The patient is not nervous/anxious and does not have insomnia.        Objective:  Blood pressure 140/90, pulse 76, height 5\' 2"  (1.575 m), weight 167 lb 4.8 oz (75.887 kg).   Physical Exam  Vitals reviewed. Constitutional: She is oriented to person, place, and time. She appears well-developed  and well-nourished.  HENT:  Head: Normocephalic and atraumatic.        Right Ear: External ear normal.  Left Ear: External ear normal.  Nose: Nose normal.  Mouth/Throat: Oropharynx is clear and moist.  Eyes: Conjunctivae and EOM are normal. Pupils are equal, round, and reactive to light. Right eye exhibits no discharge. Left eye exhibits no discharge. No  scleral icterus.  Neck: Normal range of motion. Neck supple. No tracheal deviation present. No thyromegaly present.  Cardiovascular: Normal rate, regular rhythm, normal heart sounds and intact distal pulses.  Exam reveals no gallop and no friction rub.   No murmur heard. Respiratory: Effort normal and breath sounds normal. No respiratory distress. She has no wheezes. She has no rales. She exhibits no tenderness.  GI: Soft. Bowel sounds are normal. She exhibits no distension and no mass. There is no tenderness. There is no rebound and no guarding.  Genitourinary:  Breasts no masses skin changes or nipple changes bilaterally      Vulva is normal without lesions Vagina is pink moist without discharge Cervix absent Uterus is absent Adnexa is negative {Rectal    hemoccult negative, normal tone, no masses  Musculoskeletal: Normal range of motion. She exhibits no edema and no tenderness.  Neurological: She is alert and oriented to person, place, and time. She has normal reflexes. She displays normal reflexes. No cranial nerve deficit. She exhibits normal muscle tone. Coordination normal.  Skin: Skin is warm and dry. No rash noted. No erythema. No pallor.  Psychiatric: She has a normal mood and affect. Her behavior is normal. Judgment and thought content normal.       Assessment:    Healthy female exam.    Plan:    Mammogram ordered. Follow up in: 1 year.

## 2015-04-17 LAB — CYTOLOGY - PAP

## 2015-07-29 DIAGNOSIS — L03019 Cellulitis of unspecified finger: Secondary | ICD-10-CM | POA: Diagnosis not present

## 2015-07-30 ENCOUNTER — Encounter (HOSPITAL_COMMUNITY): Payer: Self-pay | Admitting: Emergency Medicine

## 2015-07-30 ENCOUNTER — Emergency Department (HOSPITAL_COMMUNITY): Payer: BLUE CROSS/BLUE SHIELD

## 2015-07-30 ENCOUNTER — Inpatient Hospital Stay (HOSPITAL_COMMUNITY)
Admission: EM | Admit: 2015-07-30 | Discharge: 2015-08-02 | DRG: 603 | Disposition: A | Discharge status: Home / Self Care | Payer: BLUE CROSS/BLUE SHIELD | Attending: Internal Medicine | Admitting: Internal Medicine

## 2015-07-30 DIAGNOSIS — F419 Anxiety disorder, unspecified: Secondary | ICD-10-CM | POA: Diagnosis present

## 2015-07-30 DIAGNOSIS — L02511 Cutaneous abscess of right hand: Secondary | ICD-10-CM | POA: Diagnosis not present

## 2015-07-30 DIAGNOSIS — L03019 Cellulitis of unspecified finger: Secondary | ICD-10-CM | POA: Diagnosis present

## 2015-07-30 DIAGNOSIS — L409 Psoriasis, unspecified: Secondary | ICD-10-CM | POA: Diagnosis not present

## 2015-07-30 DIAGNOSIS — Z01818 Encounter for other preprocedural examination: Secondary | ICD-10-CM

## 2015-07-30 DIAGNOSIS — E7800 Pure hypercholesterolemia, unspecified: Secondary | ICD-10-CM | POA: Diagnosis not present

## 2015-07-30 DIAGNOSIS — Z8041 Family history of malignant neoplasm of ovary: Secondary | ICD-10-CM

## 2015-07-30 DIAGNOSIS — Z87891 Personal history of nicotine dependence: Secondary | ICD-10-CM | POA: Diagnosis not present

## 2015-07-30 DIAGNOSIS — E785 Hyperlipidemia, unspecified: Secondary | ICD-10-CM | POA: Diagnosis present

## 2015-07-30 DIAGNOSIS — Z801 Family history of malignant neoplasm of trachea, bronchus and lung: Secondary | ICD-10-CM

## 2015-07-30 DIAGNOSIS — L03011 Cellulitis of right finger: Secondary | ICD-10-CM

## 2015-07-30 DIAGNOSIS — L03119 Cellulitis of unspecified part of limb: Secondary | ICD-10-CM | POA: Diagnosis present

## 2015-07-30 DIAGNOSIS — Z8052 Family history of malignant neoplasm of bladder: Secondary | ICD-10-CM | POA: Diagnosis not present

## 2015-07-30 DIAGNOSIS — M79641 Pain in right hand: Secondary | ICD-10-CM | POA: Diagnosis not present

## 2015-07-30 DIAGNOSIS — M7989 Other specified soft tissue disorders: Secondary | ICD-10-CM | POA: Diagnosis not present

## 2015-07-30 LAB — BASIC METABOLIC PANEL
Anion gap: 10 (ref 5–15)
BUN: 12 mg/dL (ref 6–20)
CALCIUM: 8.9 mg/dL (ref 8.9–10.3)
CO2: 24 mmol/L (ref 22–32)
CREATININE: 0.62 mg/dL (ref 0.44–1.00)
Chloride: 106 mmol/L (ref 101–111)
Glucose, Bld: 97 mg/dL (ref 65–99)
Potassium: 4.2 mmol/L (ref 3.5–5.1)
Sodium: 140 mmol/L (ref 135–145)

## 2015-07-30 LAB — CBC WITH DIFFERENTIAL/PLATELET
BASOS PCT: 0 %
Basophils Absolute: 0 10*3/uL (ref 0.0–0.1)
EOS ABS: 0 10*3/uL (ref 0.0–0.7)
EOS PCT: 0 %
HEMATOCRIT: 43.8 % (ref 36.0–46.0)
Hemoglobin: 14.6 g/dL (ref 12.0–15.0)
Lymphocytes Relative: 10 %
Lymphs Abs: 2.5 10*3/uL (ref 0.7–4.0)
MCH: 30.3 pg (ref 26.0–34.0)
MCHC: 33.3 g/dL (ref 30.0–36.0)
MCV: 90.9 fL (ref 78.0–100.0)
MONO ABS: 1.6 10*3/uL — AB (ref 0.1–1.0)
MONOS PCT: 6 %
NEUTROS ABS: 21.1 10*3/uL — AB (ref 1.7–7.7)
Neutrophils Relative %: 84 %
PLATELETS: 371 10*3/uL (ref 150–400)
RBC: 4.82 MIL/uL (ref 3.87–5.11)
RDW: 12.9 % (ref 11.5–15.5)
WBC: 25.2 10*3/uL — ABNORMAL HIGH (ref 4.0–10.5)

## 2015-07-30 LAB — CBC
HEMATOCRIT: 41.5 % (ref 36.0–46.0)
HEMOGLOBIN: 13.4 g/dL (ref 12.0–15.0)
MCH: 29.8 pg (ref 26.0–34.0)
MCHC: 32.3 g/dL (ref 30.0–36.0)
MCV: 92.2 fL (ref 78.0–100.0)
Platelets: 329 10*3/uL (ref 150–400)
RBC: 4.5 MIL/uL (ref 3.87–5.11)
RDW: 13.2 % (ref 11.5–15.5)
WBC: 22.9 10*3/uL — ABNORMAL HIGH (ref 4.0–10.5)

## 2015-07-30 LAB — CREATININE, SERUM
Creatinine, Ser: 0.83 mg/dL (ref 0.44–1.00)
GFR calc Af Amer: >60 mL/min (ref >60)
GFR calc non Af Amer: >60 mL/min (ref >60)

## 2015-07-30 LAB — LACTIC ACID, PLASMA
LACTIC ACID, VENOUS: 1.2 mmol/L (ref 0.5–2.0)
LACTIC ACID, VENOUS: 1 mmol/L (ref 0.5–2.0)

## 2015-07-30 MED ORDER — SODIUM CHLORIDE 0.9 % IV SOLN
INTRAVENOUS | Status: DC
  Administered 2015-07-30 – 2015-08-02 (×4): via INTRAVENOUS

## 2015-07-30 MED ORDER — ROSUVASTATIN CALCIUM 20 MG PO TABS
20.0000 mg | ORAL_TABLET | Freq: Every day | ORAL | Status: DC
  Administered 2015-07-30 – 2015-08-01 (×3): 20 mg via ORAL
  Filled 2015-07-30 (×3): qty 1

## 2015-07-30 MED ORDER — OXYCODONE-ACETAMINOPHEN 5-325 MG PO TABS
1.0000 | ORAL_TABLET | Freq: Once | ORAL | Status: AC
  Administered 2015-07-30: 1 via ORAL
  Filled 2015-07-30: qty 1

## 2015-07-30 MED ORDER — ACETAMINOPHEN 325 MG PO TABS: 650.0000 mg | ORAL_TABLET | Freq: Four times a day (QID) | ORAL | Status: DC | PRN

## 2015-07-30 MED ORDER — ONDANSETRON HCL 4 MG/2ML IJ SOLN: 4.0000 mg | Freq: Four times a day (QID) | INTRAMUSCULAR | Status: DC | PRN

## 2015-07-30 MED ORDER — ALPRAZOLAM 0.5 MG PO TABS
1.0000 mg | ORAL_TABLET | Freq: Three times a day (TID) | ORAL | Status: DC | PRN
  Administered 2015-07-30 – 2015-08-02 (×9): 1 mg via ORAL
  Filled 2015-07-30 (×9): qty 2

## 2015-07-30 MED ORDER — ACETAMINOPHEN 650 MG RE SUPP: 650.0000 mg | Freq: Four times a day (QID) | RECTAL | Status: DC | PRN

## 2015-07-30 MED ORDER — SERTRALINE HCL 50 MG PO TABS
50.0000 mg | ORAL_TABLET | Freq: Every day | ORAL | Status: DC
  Administered 2015-07-31 – 2015-08-02 (×3): 50 mg via ORAL
  Filled 2015-07-30 (×3): qty 1

## 2015-07-30 MED ORDER — SENNOSIDES-DOCUSATE SODIUM 8.6-50 MG PO TABS: 1.0000 | ORAL_TABLET | Freq: Every evening | ORAL | Status: DC | PRN

## 2015-07-30 MED ORDER — CLINDAMYCIN PHOSPHATE 600 MG/50ML IV SOLN
600.0000 mg | Freq: Once | INTRAVENOUS | Status: AC
  Administered 2015-07-30: 600 mg via INTRAVENOUS
  Filled 2015-07-30: qty 50

## 2015-07-30 MED ORDER — PREDNISONE 20 MG PO TABS
40.0000 mg | ORAL_TABLET | Freq: Every day | ORAL | Status: DC
  Administered 2015-07-30 – 2015-08-01 (×3): 40 mg via ORAL
  Filled 2015-07-30 (×3): qty 2

## 2015-07-30 MED ORDER — SULFAMETHOXAZOLE-TRIMETHOPRIM 800-160 MG PO TABS
2.0000 | ORAL_TABLET | Freq: Two times a day (BID) | ORAL | Status: DC
  Filled 2015-07-30: qty 2

## 2015-07-30 MED ORDER — OXYCODONE HCL 5 MG PO TABS
5.0000 mg | ORAL_TABLET | ORAL | Status: DC | PRN
  Administered 2015-07-30 – 2015-08-02 (×10): 5 mg via ORAL
  Filled 2015-07-30 (×10): qty 1

## 2015-07-30 MED ORDER — ALPRAZOLAM 1 MG PO TABS
1.0000 mg | ORAL_TABLET | Freq: Every day | ORAL | Status: DC
  Filled 2015-07-30: qty 1

## 2015-07-30 MED ORDER — CELECOXIB 100 MG PO CAPS
200.0000 mg | ORAL_CAPSULE | Freq: Every day | ORAL | Status: DC
  Administered 2015-07-30 – 2015-08-02 (×4): 200 mg via ORAL
  Filled 2015-07-30 (×4): qty 2

## 2015-07-30 MED ORDER — ENOXAPARIN SODIUM 40 MG/0.4ML ~~LOC~~ SOLN
40.0000 mg | SUBCUTANEOUS | Status: DC
  Administered 2015-07-30: 40 mg via SUBCUTANEOUS
  Filled 2015-07-30: qty 0.4

## 2015-07-30 MED ORDER — ONDANSETRON HCL 4 MG PO TABS: 4.0000 mg | ORAL_TABLET | Freq: Four times a day (QID) | ORAL | Status: DC | PRN

## 2015-07-30 MED ORDER — SULFAMETHOXAZOLE-TRIMETHOPRIM 800-160 MG PO TABS
1.0000 | ORAL_TABLET | Freq: Two times a day (BID) | ORAL | Status: DC
  Administered 2015-07-30 – 2015-07-31 (×2): 1 via ORAL
  Filled 2015-07-30 (×2): qty 1

## 2015-07-30 NOTE — H&P (Signed)
[facility]          History and Physical    PCP:   [name], MD   EDP: Francine Graven, D.O.  Chief Complaint:  Right ring finger redness and pain  HPI: Patient is a 60 year old woman with history of psoriasis currently under prednisone taper, hyperlipidemia who presents to the hospital today with the above complaints. She states that 4 days prior to admission she noticed a "pimple" over her right ring finger that she thought may have been a spider bite. She picked at it and had a small amount of pus that was released. She has subsequently noticed that her finger has gotten progressively red and swollen. She saw her PCP yesterday who prescribed her a course of Bactrim. She took 1 dose last night however when she woke up this morning the erythema and edema was much worse prompting her visit to the emergency department. She was found to have a white blood cell count of 25.2, has been afebrile denies chills. EDP has consulted with orthopedics, Dr. Aline Brochure who advises patient should be okay to admit to [facility] and he will follow. We are asked to admit her for further evaluation and management.  Allergies:  No Known Allergies    Past Medical History  Diagnosis Date  . Hypercholesteremia   . Headache(784.0)   . Cancer [facility])     Past Surgical History  Procedure Laterality Date  . Abdominal hysterectomy    . Colonoscopy  04/05/2012    Procedure: COLONOSCOPY;  Surgeon: Rogene Houston, MD;  [facility];  Service: Endoscopy;  Laterality: N/A;  930    Prior to Admission medications   Medication Sig Start Date End Date Taking? Authorizing Provider  ALPRAZolam Duanne Moron) 1 MG tablet Take 1 mg by mouth daily.   Yes Historical Provider, MD  celecoxib (CELEBREX) 200 MG capsule Take 200 mg by mouth daily.   Yes Historical Provider, MD  Doxylamine Succinate, Sleep, (SLEEP AID PO) Take 1 tablet by mouth at bedtime.   Yes Historical Provider, MD  predniSONE  (STERAPRED UNI-PAK 48 TAB) 10 MG (48) TBPK tablet TAKE AS DIRECTED WITH FOOD PER PACKAGE DIRECTIONS ORALLY 07/22/15  Yes Historical Provider, MD  rosuvastatin (CRESTOR) 20 MG tablet Take 20 mg by mouth daily.   Yes Historical Provider, MD  sertraline (ZOLOFT) 50 MG tablet Take 50 mg by mouth daily. 07/24/15  Yes Historical Provider, MD  sulfamethoxazole-trimethoprim (BACTRIM DS,SEPTRA DS) 800-160 MG tablet Take 1 tablet by mouth 2 (two) times daily.   Yes Historical Provider, MD  Vitamin D, Ergocalciferol, (DRISDOL) 50000 units CAPS capsule TAKE 1 CAPSULE BY MOUTH WEEKLY 07/24/15  Yes Historical Provider, MD    Social History:  reports that she has quit smoking. She does not have any smokeless tobacco history on file. She reports that she does not drink alcohol or use illicit drugs.  Family History  Problem Relation Age of Onset  . Colon cancer Neg Hx   . Ovarian cancer Mother   . Bladder Cancer Father   . Lung cancer Sister     Review of Systems:  Constitutional: Denies fever, chills, diaphoresis, appetite change and fatigue.  HEENT: Denies photophobia, eye pain, redness, hearing loss, ear pain, congestion, sore throat, rhinorrhea, sneezing, mouth sores, trouble swallowing, neck pain, neck stiffness and tinnitus.   Respiratory: Denies SOB, DOE, cough, chest tightness,  and wheezing.   Cardiovascular: Denies  chest pain, palpitations and leg swelling.  Gastrointestinal: Denies nausea, vomiting, abdominal pain, diarrhea, constipation, blood in stool and abdominal distention.  Genitourinary: Denies dysuria, urgency, frequency, hematuria, flank pain and difficulty urinating.  Endocrine: Denies: hot or cold intolerance, sweats, changes in hair or nails, polyuria, polydipsia. Musculoskeletal: Denies myalgias, back pain, joint swelling, arthralgias and gait problem.  Skin: Denies pallor, rash and wound.  Neurological: Denies dizziness, seizures, syncope, weakness, light-headedness, numbness and  headaches.  Hematological: Denies adenopathy. Easy bruising, personal or family bleeding history  Psychiatric/Behavioral: Denies suicidal ideation, mood changes, confusion, nervousness, sleep disturbance and agitation   Physical Exam: Blood pressure 130/68, pulse 57, temperature 97.7 F (36.5 C), temperature source Oral, resp. rate 18, height _0  (1.575 m), weight 72.439 kg (159 lb 11.2 oz), SpO2 97 %. General: Alert, awake, oriented 3 HEENT: Normocephalic, atraumatic, pupils equal round and reactive to light, Shockley movements intact, moist mucous membranes, wears corrective lenses Neck: Supple, no JVD, no lymphadenopathy, no bruits, no quarter Cardiovascular: Regular rate and rhythm, no murmurs, rubs or gallops Lungs: Clear to auscultation bilaterally Abdomen: Soft, nontender, nondistended, positive bowel sounds Extremities: No clubbing, cyanosis or edema of lower extremities, see picture of right hand cellulitis:      Neurologic: Grossly intact and nonfocal  Labs on Admission:  Results for orders placed or performed during the hospital encounter of 07/30/15 (from the past 48 hour(s))  Lactic acid, plasma     Status: None   Collection Time: 07/30/15 10:20 AM  Result Value Ref Range   Lactic Acid, Venous 1.2 0.5 - 2.0 mmol/L  CBC with Differential     Status: Abnormal   Collection Time: 07/30/15 10:20 AM  Result Value Ref Range   WBC 25.2 (H) 4.0 - 10.5 K/uL   RBC 4.82 3.87 - 5.11 MIL/uL   Hemoglobin 14.6 12.0 - 15.0 g/dL   HCT 43.8 36.0 - 46.0 %   MCV 90.9 78.0 - 100.0 fL   MCH 30.3 26.0 - 34.0 pg   MCHC 33.3 30.0 - 36.0 g/dL   RDW 12.9 11.5 - 15.5 %   Platelets 371 150 - 400 K/uL   Neutrophils Relative % 84 %   Neutro Abs 21.1 (H) 1.7 - 7.7 K/uL   Lymphocytes Relative 10 %   Lymphs Abs 2.5 0.7 - 4.0 K/uL   Monocytes Relative 6 %   Monocytes Absolute 1.6 (H) 0.1 - 1.0 K/uL   Eosinophils Relative 0 %   Eosinophils Absolute 0.0 0.0 - 0.7 K/uL   Basophils Relative 0  %   Basophils Absolute 0.0 0.0 - 0.1 K/uL  Basic metabolic panel     Status: None   Collection Time: 07/30/15 10:20 AM  Result Value Ref Range   Sodium 140 135 - 145 mmol/L   Potassium 4.2 3.5 - 5.1 mmol/L   Chloride 106 101 - 111 mmol/L   CO2 24 22 - 32 mmol/L   Glucose, Bld 97 65 - 99 mg/dL   BUN 12 6 - 20 mg/dL   Creatinine, Ser 0.62 0.44 - 1.00 mg/dL   Calcium 8.9 8.9 - 10.3 mg/dL   GFR calc non Af Amer >60 >60 mL/min   GFR calc Af Amer >60 >60 mL/min    Comment: (NOTE) The eGFR has been calculated using the CKD EPI equation. This calculation has not been validated in all clinical situations. eGFR's persistently <60 mL/min signify possible Chronic Kidney Disease.    Anion gap 10 5 - 15  Lactic acid, plasma  Status: None   Collection Time: 07/30/15  2:16 PM  Result Value Ref Range   Lactic Acid, Venous 1.0 0.5 - 2.0 mmol/L    Radiological Exams on Admission: Dg Finger Ring Right  07/30/2015  CLINICAL DATA:  Right ring finger swelling from possible spider bite. Spreading redness. EXAM: RIGHT RING FINGER 2+V COMPARISON:  None. FINDINGS: No soft tissue gas, opaque foreign body, or evidence of osseous infection. Osteopenia. IMPRESSION: Soft tissue swelling without gas or foreign body. No evidence of osseous infection. Electronically Signed   By: Monte Fantasia M.D.   On: 07/30/2015 11:43    Assessment/Plan Principal Problem:   Cellulitis of ring finger Active Problems:   Hypercholesteremia   Anxiety   Cellulitis of hand    Right ring finger cellulitis -Likely precipitated by insect bite. -Was given a dose of clindamycin in the ED. -Follow cellulitis order set, will start patient on Bactrim orally. -Blood cultures have been requested. -Orthopedics, Dr. Aline Brochure to see in consultation and provide further recommendations regarding potential I and D. Next  Hyperlipidemia -Continue statin  Anxiety disorder -Continue psychotropic medications.  Psoriasis -Is  currently undergoing a prednisone taper, today is the last day of 40 mg and then she takes 20 mg for the next 4 days.  DVT prophylaxis -Lovenox  CODE STATUS -Full code   Time Spent on Admission: 65 minutes  HERNANDEZ ACOSTA,ESTELA [facility] Pager: [phone] 07/30/2015, 3:23 PM

## 2015-07-30 NOTE — Progress Notes (Signed)
ANTIBIOTIC CONSULT NOTE  Pharmacy Consult for Renal Adjustment of ABX if needed  ABX:  Bactrim DS 1 po BID from PTA med list  No Known Allergies  Patient Measurements: Height: 5\' 2"  (157.5 cm) Weight: 159 lb 11.2 oz (72.439 kg) IBW/kg (Calculated) : 50.1  Vital Signs: Temp: 97.7 F (36.5 C) (04/06 1412) Temp Source: Oral (04/06 1412) BP: 130/68 mmHg (04/06 1412) Pulse Rate: 57 (04/06 1412)  Labs:  Recent Labs  07/30/15 1020  WBC 25.2*  HGB 14.6  PLT 371  CREATININE 0.62   No results for input(s): VANCOTROUGH, VANCOPEAK, VANCORANDOM, GENTTROUGH, GENTPEAK, GENTRANDOM, TOBRATROUGH, TOBRAPEAK, TOBRARND, AMIKACINPEAK, AMIKACINTROU, AMIKACIN in the last 72 hours.   Medical History: Past Medical History  Diagnosis Date  . Hypercholesteremia   . Headache(784.0)   . Cancer [facility])    Medications Prior to Admission  Medication Sig Dispense Refill  . ALPRAZolam (XANAX) 1 MG tablet Take 1 mg by mouth daily.    . celecoxib (CELEBREX) 200 MG capsule Take 200 mg by mouth daily.    . Doxylamine Succinate, Sleep, (SLEEP AID PO) Take 1 tablet by mouth at bedtime.    . predniSONE (STERAPRED UNI-PAK 48 TAB) 10 MG (48) TBPK tablet TAKE AS DIRECTED WITH FOOD PER PACKAGE DIRECTIONS ORALLY  0  . rosuvastatin (CRESTOR) 20 MG tablet Take 20 mg by mouth daily.    . sertraline (ZOLOFT) 50 MG tablet Take 50 mg by mouth daily.  2  . sulfamethoxazole-trimethoprim (BACTRIM DS,SEPTRA DS) 800-160 MG tablet Take 1 tablet by mouth 2 (two) times daily.    . Vitamin D, Ergocalciferol, (DRISDOL) 50000 units CAPS capsule TAKE 1 CAPSULE BY MOUTH WEEKLY  2    Assessment: 60yo female, states that 4 days prior to admission she noticed a "pimple" over her right ring finger that she thought may have been a spider bite. She picked at it and had a small amount of pus that was released. She has subsequently noticed that her finger has gotten progressively red and swollen. She saw her PCP yesterday who prescribed  her a course of Bactrim. She took 1 dose last night however when she woke up this morning the erythema and edema was much worse prompting her visit to the emergency department.   Estimated Creatinine Clearance: 67.9 mL/min (by C-G formula based on Cr of 0.62).  Plan: Continue current Rx F/U progress and c/s  Ena Dawley, [facility] 07/30/2015

## 2015-07-30 NOTE — ED Notes (Signed)
Pt reports was bitten by a spider on right ring finger on Monday. Pt reports increased pain and nausea. Pt reports was seen at PCP officer and prescribed abx yesterday. Pt reports redness is starting to spread up right hand. nad noted.

## 2015-07-30 NOTE — ED Provider Notes (Signed)
CSN: [id number]     Arrival date & time 07/30/15  [id number] History   First MD Initiated Contact with Patient 07/30/15 1011     Chief Complaint  Patient presents with  . Hand Problem      HPI Pt was seen at 1020. Per pt, c/o gradual onset and worsening of persistent "red rash" that began Monday (3 days ago). Pt states she "thinks I got bit by something." States there "was a small whitehead" on her finger that she "picked off" and the area "drained a little bit." Pt states she was evaluated by her PMD yesterday for same, rx bactrim. States "it's getting worse." States the redness is now spreading up into her dorsal hand. Denies further drainage, no fevers, no injury, no focal motor weakness, no tingling/numbness in extremities.   Td UTD Past Medical History  Diagnosis Date  . Hypercholesteremia   . Headache(784.0)   . Cancer [facility])    Past Surgical History  Procedure Laterality Date  . Abdominal hysterectomy    . Colonoscopy  04/05/2012    Procedure: COLONOSCOPY;  Surgeon: Rogene Houston, MD;  [facility];  Service: Endoscopy;  Laterality: N/A;  930   Family History  Problem Relation Age of Onset  . Colon cancer Neg Hx   . Ovarian cancer Mother   . Bladder Cancer Father   . Lung cancer Sister    Social History  Substance Use Topics  . Smoking status: Former Smoker -- 1.00 packs/day for 44 years  . Smokeless tobacco: None  . Alcohol Use: No    Review of Systems ROS: Statement: All systems negative except as marked or noted in the HPI; Constitutional: Negative for fever and chills. ; ; Eyes: Negative for eye pain, redness and discharge. ; ; ENMT: Negative for ear pain, hoarseness, nasal congestion, sinus pressure and sore throat. ; ; Cardiovascular: Negative for chest pain, palpitations, diaphoresis, dyspnea and peripheral edema. ; ; Respiratory: Negative for cough, wheezing and stridor. ; ; Gastrointestinal: Negative for nausea, vomiting, diarrhea, abdominal pain, blood in  stool, hematemesis, jaundice and rectal bleeding. . ; ; Genitourinary: Negative for dysuria, flank pain and hematuria. ; ; Musculoskeletal: Negative for back pain and neck pain. Negative for swelling and deformity.; ; Skin: +rash. Negative for pruritus, abrasions, blisters, bruising and skin lesion.; ; Neuro: Negative for headache, lightheadedness and neck stiffness. Negative for weakness, altered level of consciousness , altered mental status, extremity weakness, paresthesias, involuntary movement, seizure and syncope.      Allergies  Review of patient's allergies indicates no known allergies.  Home Medications   Prior to Admission medications   Medication Sig Start Date End Date Taking? Authorizing Provider  ALPRAZolam Duanne Moron) 1 MG tablet Take 1 mg by mouth daily.   Yes Historical Provider, MD  celecoxib (CELEBREX) 200 MG capsule Take 200 mg by mouth daily.   Yes Historical Provider, MD  Doxylamine Succinate, Sleep, (SLEEP AID PO) Take 1 tablet by mouth at bedtime.   Yes Historical Provider, MD  predniSONE (STERAPRED UNI-PAK 48 TAB) 10 MG (48) TBPK tablet TAKE AS DIRECTED WITH FOOD PER PACKAGE DIRECTIONS ORALLY 07/22/15  Yes Historical Provider, MD  rosuvastatin (CRESTOR) 20 MG tablet Take 20 mg by mouth daily.   Yes Historical Provider, MD  sertraline (ZOLOFT) 50 MG tablet Take 50 mg by mouth daily. 07/24/15  Yes Historical Provider, MD  sulfamethoxazole-trimethoprim (BACTRIM DS,SEPTRA DS) 800-160 MG tablet Take 1 tablet by mouth 2 (two) times daily.   Yes  Historical Provider, MD  Vitamin D, Ergocalciferol, (DRISDOL) 50000 units CAPS capsule TAKE 1 CAPSULE BY MOUTH WEEKLY 07/24/15  Yes Historical Provider, MD   BP 147/96 mmHg  Pulse 64  Temp(Src) 97.9 F (36.6 C) (Oral)  Resp 18  Ht 5\' 2"  (1.575 m)  Wt 160 lb (72.576 kg)  BMI 29.26 kg/m2  SpO2 98% Physical Exam  1025: Physical examination:  Nursing notes reviewed; Vital signs and O2 SAT reviewed;  Constitutional: Well developed, Well  nourished, Well hydrated, In no acute distress; Head:  Normocephalic, atraumatic; Eyes: EOMI, PERRL, No scleral icterus; ENMT: Mouth and pharynx normal, Mucous membranes moist; Neck: Supple, Full range of motion, No lymphadenopathy; Cardiovascular: Regular rate and rhythm, No gallop; Respiratory: Breath sounds clear & equal bilaterally, No wheezes.  Speaking full sentences with ease, Normal respiratory effort/excursion; Chest: Nontender, Movement normal; Abdomen: Soft, Nontender, Nondistended, Normal bowel sounds; Genitourinary: No CVA tenderness; Extremities: Pulses normal, No deformity. +right dorsal ring finger with erythema extending to MCP's and proximal dorsal hand, +small scabbed area dorsal ring finger proximal phalanx.; Neuro: AA&Ox3, Major CN grossly intact.  Speech clear. No gross focal motor or sensory deficits in extremities.; Skin: Color normal, Warm, Dry.   ED Course  Procedures (including critical care time) Labs Review  Imaging Review  I have personally reviewed and evaluated these images and lab results as part of my medical decision-making.   EKG Interpretation None      EMERGENCY DEPARTMENT US SOFT TISSUE INTERPRETATION "Study: Limited Ultrasound of the noted body part in comments below"  INDICATIONS: Pain and Soft tissue infection Multiple views of the body part are obtained with a multi-frequency linear probe  PERFORMED BY:  Myself  IMAGES ARCHIVED?: No  SIDE:Right   BODY PART:Other soft tisse (comment in note)  FINDINGS: No abcess noted and Cellulitis present  LIMITATIONS:  Body Habitus and Emergent Procedure  INTERPRETATION:  No abcess noted and Cellulitis present  COMMENT:  Right dorsal ring finger: +cellulitis, no abscess.    MDM  MDM Reviewed: previous chart, nursing note and vitals Reviewed previous: labs Interpretation: labs and x-ray     Results for orders placed or performed during the hospital encounter of 07/30/15  Lactic acid, plasma   Result Value Ref Range   Lactic Acid, Venous 1.2 0.5 - 2.0 mmol/L  CBC with Differential  Result Value Ref Range   WBC 25.2 (H) 4.0 - 10.5 K/uL   RBC 4.82 3.87 - 5.11 MIL/uL   Hemoglobin 14.6 12.0 - 15.0 g/dL   HCT 43.8 36.0 - 46.0 %   MCV 90.9 78.0 - 100.0 fL   MCH 30.3 26.0 - 34.0 pg   MCHC 33.3 30.0 - 36.0 g/dL   RDW 12.9 11.5 - 15.5 %   Platelets 371 150 - 400 K/uL   Neutrophils Relative % 84 %   Neutro Abs 21.1 (H) 1.7 - 7.7 K/uL   Lymphocytes Relative 10 %   Lymphs Abs 2.5 0.7 - 4.0 K/uL   Monocytes Relative 6 %   Monocytes Absolute 1.6 (H) 0.1 - 1.0 K/uL   Eosinophils Relative 0 %   Eosinophils Absolute 0.0 0.0 - 0.7 K/uL   Basophils Relative 0 %   Basophils Absolute 0.0 0.0 - 0.1 K/uL  Basic metabolic panel  Result Value Ref Range   Sodium 140 135 - 145 mmol/L   Potassium 4.2 3.5 - 5.1 mmol/L   Chloride 106 101 - 111 mmol/L   CO2 24 22 - 32 mmol/L   Glucose,  Bld 97 65 - 99 mg/dL   BUN 12 6 - 20 mg/dL   Creatinine, Ser 0.62 0.44 - 1.00 mg/dL   Calcium 8.9 8.9 - 10.3 mg/dL   GFR calc non Af Amer >60 >60 mL/min   GFR calc Af Amer >60 >60 mL/min   Anion gap 10 5 - 15    1130:  Scab on finger unroofed; very scant amount of purulent drainage manually expressed. [place] right dorsal ring finger without abscess. IV clindamycin started. WBC count elevated higher than expected for pt being on prednisone; will observation admit.  T/C to [facility] Dr. Jerilee Hoh, case discussed, including:  HPI, pertinent PM/SHx, VS/PE, dx testing, ED course and treatment:  Agreeable to admit, requests to write temporary orders, obtain medical bed to team [facility].  T/C to Ortho Dr. Aline Brochure, case discussed, including:  HPI, pertinent PM/SHx, VS/PE, dx testing, ED course and treatment:  Agreeable to consult.       Francine Graven, DO 08/03/15 [phone]

## 2015-07-30 NOTE — ED Notes (Signed)
MD at bedside. 

## 2015-07-31 DIAGNOSIS — E785 Hyperlipidemia, unspecified: Secondary | ICD-10-CM | POA: Diagnosis not present

## 2015-07-31 DIAGNOSIS — L409 Psoriasis, unspecified: Secondary | ICD-10-CM | POA: Diagnosis present

## 2015-07-31 DIAGNOSIS — E7800 Pure hypercholesterolemia, unspecified: Secondary | ICD-10-CM | POA: Diagnosis present

## 2015-07-31 DIAGNOSIS — Z01818 Encounter for other preprocedural examination: Secondary | ICD-10-CM | POA: Diagnosis not present

## 2015-07-31 DIAGNOSIS — Z8041 Family history of malignant neoplasm of ovary: Secondary | ICD-10-CM | POA: Diagnosis not present

## 2015-07-31 DIAGNOSIS — L02511 Cutaneous abscess of right hand: Secondary | ICD-10-CM | POA: Diagnosis not present

## 2015-07-31 DIAGNOSIS — M79641 Pain in right hand: Secondary | ICD-10-CM | POA: Diagnosis present

## 2015-07-31 DIAGNOSIS — Z8052 Family history of malignant neoplasm of bladder: Secondary | ICD-10-CM | POA: Diagnosis not present

## 2015-07-31 DIAGNOSIS — Z801 Family history of malignant neoplasm of trachea, bronchus and lung: Secondary | ICD-10-CM | POA: Diagnosis not present

## 2015-07-31 DIAGNOSIS — L03011 Cellulitis of right finger: Secondary | ICD-10-CM | POA: Diagnosis not present

## 2015-07-31 DIAGNOSIS — F419 Anxiety disorder, unspecified: Secondary | ICD-10-CM | POA: Diagnosis present

## 2015-07-31 DIAGNOSIS — Z87891 Personal history of nicotine dependence: Secondary | ICD-10-CM | POA: Diagnosis not present

## 2015-07-31 LAB — CBC
HEMATOCRIT: 42.5 % (ref 36.0–46.0)
HEMOGLOBIN: 13.6 g/dL (ref 12.0–15.0)
MCH: 30 pg (ref 26.0–34.0)
MCHC: 32 g/dL (ref 30.0–36.0)
MCV: 93.8 fL (ref 78.0–100.0)
Platelets: 289 10*3/uL (ref 150–400)
RBC: 4.53 MIL/uL (ref 3.87–5.11)
RDW: 13.3 % (ref 11.5–15.5)
WBC: 14.2 10*3/uL — ABNORMAL HIGH (ref 4.0–10.5)

## 2015-07-31 LAB — BASIC METABOLIC PANEL
ANION GAP: 6 (ref 5–15)
BUN: 14 mg/dL (ref 6–20)
CO2: 27 mmol/L (ref 22–32)
Calcium: 8.6 mg/dL — ABNORMAL LOW (ref 8.9–10.3)
Chloride: 105 mmol/L (ref 101–111)
Creatinine, Ser: 0.74 mg/dL (ref 0.44–1.00)
GFR calc Af Amer: >60 mL/min (ref >60)
GLUCOSE: 129 mg/dL — AB (ref 65–99)
POTASSIUM: 5 mmol/L (ref 3.5–5.1)
Sodium: 138 mmol/L (ref 135–145)

## 2015-07-31 LAB — HIV ANTIBODY (ROUTINE TESTING W REFLEX): HIV SCREEN 4TH GENERATION: NONREACTIVE

## 2015-07-31 MED ORDER — CLINDAMYCIN PHOSPHATE 600 MG/50ML IV SOLN
600.0000 mg | Freq: Three times a day (TID) | INTRAVENOUS | Status: DC
  Administered 2015-07-31 – 2015-08-02 (×6): 600 mg via INTRAVENOUS
  Filled 2015-07-31 (×10): qty 50

## 2015-07-31 MED ORDER — MAGNESIUM SULFATE (LAXATIVE) PO GRAN
1.0000 g | GRANULES | Freq: Four times a day (QID) | ORAL | Status: DC
  Administered 2015-07-31 (×4): 1 g
  Filled 2015-07-31: qty 454

## 2015-07-31 NOTE — Progress Notes (Signed)
[facility] PROGRESS NOTE  Joyce [name] [facility] [id number] DOB: 1956-01-21 DOA: 07/30/2015 PCP: [name], MD  Assessment/Plan: Right ring finger cellulitis -Appears improved today after antibiotic therapy. -Note orthopedics has added IV clindamycin to by mouth Bactrim that was ordered yesterday. Clindamycin is not adding any extra coverage to Bactrim and will discontinue Bactrim at this point. No evidence that IV antibiotics are superior to by mouth antibiotics in case of cellulitis, and in fact cellulitis order set recommends Bactrim in her case. -Ortho to decide whether I and D will be warranted.  Hyperlipidemia -Continue statin  Anxiety disorder  -continue psychotropic medications  Psoriasis -Continue prednisone taper  Code Status: Full code Family Communication: Patient only  Disposition Plan: To be determined pending orthopedics recommendations   Consultants:  Orthopedics   Antibiotics:  Clindamycin   Subjective: No complaints, feels right finger is less swollen  Objective: Filed Vitals:   07/30/15 2245 07/31/15 0552 07/31/15 1042 07/31/15 1608  BP: 102/75 129/79  103/54  Pulse: 55 50  60  Temp: 98 F (36.7 C) 97.5 F (36.4 C)  97.6 F (36.4 C)  TempSrc: Oral Oral  Oral  Resp: 18 18  18   Height:      Weight:      SpO2: 96% 95% 94% 98%    Intake/Output Summary (Last 24 hours) at 07/31/15 1610 Last data filed at 07/30/15 1700  Gross per 24 hour  Intake    240 ml  Output      0 ml  Net    240 ml   Filed Weights   07/30/15 1002 07/30/15 1412  Weight: 72.576 kg (160 lb) 72.439 kg (159 lb 11.2 oz)    Exam:   General:  Alert, awake, oriented 3  Cardiovascular: Regular rate and rhythm  Respiratory: Clear to auscultation bilaterally  Abdomen: Soft, nontender, nondistended, positive bowel sounds  Extremities: No clubbing, cyanosis or edema, positive pulses   Neurologic:  Grossly intact and nonfocal  Data Reviewed: Basic Metabolic  Panel:  Recent Labs Lab 07/30/15 1020 07/30/15 1544 07/31/15 0618  NA 140  --  138  K 4.2  --  5.0  CL 106  --  105  CO2 24  --  27  GLUCOSE 97  --  129*  BUN 12  --  14  CREATININE 0.62 0.83 0.74  CALCIUM 8.9  --  8.6*   Liver Function Tests: No results for input(s): AST, ALT, ALKPHOS, BILITOT, PROT, ALBUMIN in the last 168 hours. No results for input(s): LIPASE, AMYLASE in the last 168 hours. No results for input(s): AMMONIA in the last 168 hours. CBC:  Recent Labs Lab 07/30/15 1020 07/30/15 1544 07/31/15 0618  WBC 25.2* 22.9* 14.2*  NEUTROABS 21.1*  --   --   HGB 14.6 13.4 13.6  HCT 43.8 41.5 42.5  MCV 90.9 92.2 93.8  PLT 371 329 289   Cardiac Enzymes: No results for input(s): CKTOTAL, CKMB, CKMBINDEX, TROPONINI in the last 168 hours. BNP (last 3 results) No results for input(s): BNP in the last 8760 hours.  ProBNP (last 3 results) No results for input(s): PROBNP in the last 8760 hours.  CBG: No results for input(s): GLUCAP in the last 168 hours.  Recent Results (from the past 240 hour(s))  Culture, blood (routine x 2)     Status: None (Preliminary result)   Collection Time: 07/30/15  3:44 PM  Result Value Ref Range Status   Specimen Description BLOOD RIGHT ARM  Final  Special Requests   Final    BOTTLES DRAWN AEROBIC AND ANAEROBIC AEB=7CC ANA=6CC   Culture NO GROWTH < 24 HOURS  Final   Report Status PENDING  Incomplete  Culture, blood (routine x 2)     Status: None (Preliminary result)   Collection Time: 07/30/15  3:48 PM  Result Value Ref Range Status   Specimen Description BLOOD RIGHT HAND  Final   Special Requests   Final    BOTTLES DRAWN AEROBIC AND ANAEROBIC AEB=6CC ANA=5CC   Culture NO GROWTH < 24 HOURS  Final   Report Status PENDING  Incomplete     Studies: Dg Finger Ring Right  07/30/2015  CLINICAL DATA:  Right ring finger swelling from possible spider bite. Spreading redness. EXAM: RIGHT RING FINGER 2+V COMPARISON:  None. FINDINGS: No  soft tissue gas, opaque foreign body, or evidence of osseous infection. Osteopenia. IMPRESSION: Soft tissue swelling without gas or foreign body. No evidence of osseous infection. Electronically Signed   By: Monte Fantasia M.D.   On: 07/30/2015 11:43    Scheduled Meds: . celecoxib  200 mg Oral Daily  . clindamycin (CLEOCIN) IV  600 mg Intravenous Q8H  . magnesium sulfate  1 g Other QID  . predniSONE  40 mg Oral QHS  . rosuvastatin  20 mg Oral q1800  . sertraline  50 mg Oral Daily  . sulfamethoxazole-trimethoprim  1 tablet Oral Q12H   Continuous Infusions: . sodium chloride 75 mL/hr at 07/31/15 0549    Principal Problem:   Cellulitis of ring finger Active Problems:   Hypercholesteremia   Anxiety   Cellulitis of hand    Time spent: 25 minutes. Greater than 50% of this time was spent in direct contact with the patient coordinating care.    [name]  [facility] Pager [phone]  If 7PM-7AM, please contact night-coverage at [email or web address], password [facility] 07/31/2015, 4:10 PM  LOS: 0 days

## 2015-07-31 NOTE — Care Management Note (Signed)
Case Management Note  Patient Details  Name: Joyce [name] MRN: [id number] Date of Birth: [birth year]  Subjective/Objective:                  Pt admitted with cellulitis. Pt is from home, lives alone and is ind with ADL's. Pt plans to return home with self care. Pt has PCP, transportation and no difficulty obtaining medications. Pt's support system at the bedside.   Action/Plan: No CM needs anticipated.   Expected Discharge Date:    08/01/2015              Expected Discharge Plan:  Home/Self Care  In-House Referral:  NA  Discharge planning Services  CM Consult  Post Acute Care Choice:  NA Choice offered to:  NA  DME Arranged:    DME Agency:     HH Arranged:    HH Agency:     Status of Service:  Completed, signed off  Medicare Important Message Given:    Date Medicare IM Given:    Medicare IM give by:    Date Additional Medicare IM Given:    Additional Medicare Important Message give by:     If discussed at [facility] of Stay Meetings, dates discussed:    Additional Comments:  Sherald Barge, RN 07/31/2015, 8:08 AM

## 2015-07-31 NOTE — Progress Notes (Signed)
Pt. Soaked finger in epsom salt. After a few minutes, pt stated that scab came off and proceeded to squeeze the "pus" out. Moderate amount, yellow/white in color. Swelling has subsided some. [place], [place].

## 2015-07-31 NOTE — Consult Note (Signed)
Reason for Consult: Right ring finger infection dorsally Referring Physician: Dr. Jerilee Hoh, Joyce [name] is an 60 y.o. female.  HPI: This patient has a history of psoriasis presents with pain and redness swelling and pustule formation over the right ring finger. She thinks she may been bitten by a spider but this is not definitely documented. Interpret primary care physician on the fifth came to the hospital on the sixth because of persistent pain despite oral antibiotics. She had a white count of 25 and she is admitted for IV antibiotics and possible incision and drainage  Past Medical History  Diagnosis Date  . Hypercholesteremia   . Headache(784.0)   . Cancer [facility])     Past Surgical History  Procedure Laterality Date  . Abdominal hysterectomy    . Colonoscopy  04/05/2012    Procedure: COLONOSCOPY;  Surgeon: Rogene Houston, MD;  Location: [facility];  Service: Endoscopy;  Laterality: N/A;  930    Family History  Problem Relation Age of Onset  . Colon cancer Neg Hx   . Ovarian cancer Mother   . Bladder Cancer Father   . Lung cancer Sister     Social History:  reports that she has quit smoking. She does not have any smokeless tobacco history on file. She reports that she does not drink alcohol or use illicit drugs.  Allergies: No Known Allergies  Medications: I have reviewed the patient's current medications.  Results for orders placed or performed during the hospital encounter of 07/30/15 (from the past 48 hour(s))  Lactic acid, plasma     Status: None   Collection Time: 07/30/15 10:20 AM  Result Value Ref Range   Lactic Acid, Venous 1.2 0.5 - 2.0 mmol/L  CBC with Differential     Status: Abnormal   Collection Time: 07/30/15 10:20 AM  Result Value Ref Range   WBC 25.2 (H) 4.0 - 10.5 K/uL   RBC 4.82 3.87 - 5.11 MIL/uL   Hemoglobin 14.6 12.0 - 15.0 g/dL   HCT 43.8 36.0 - 46.0 %   MCV 90.9 78.0 - 100.0 fL   MCH 30.3 26.0 - 34.0 pg   MCHC 33.3 30.0 - 36.0  g/dL   RDW 12.9 11.5 - 15.5 %   Platelets 371 150 - 400 K/uL   Neutrophils Relative % 84 %   Neutro Abs 21.1 (H) 1.7 - 7.7 K/uL   Lymphocytes Relative 10 %   Lymphs Abs 2.5 0.7 - 4.0 K/uL   Monocytes Relative 6 %   Monocytes Absolute 1.6 (H) 0.1 - 1.0 K/uL   Eosinophils Relative 0 %   Eosinophils Absolute 0.0 0.0 - 0.7 K/uL   Basophils Relative 0 %   Basophils Absolute 0.0 0.0 - 0.1 K/uL  Basic metabolic panel     Status: None   Collection Time: 07/30/15 10:20 AM  Result Value Ref Range   Sodium 140 135 - 145 mmol/L   Potassium 4.2 3.5 - 5.1 mmol/L   Chloride 106 101 - 111 mmol/L   CO2 24 22 - 32 mmol/L   Glucose, Bld 97 65 - 99 mg/dL   BUN 12 6 - 20 mg/dL   Creatinine, Ser 0.62 0.44 - 1.00 mg/dL   Calcium 8.9 8.9 - 10.3 mg/dL   GFR calc non Af Amer >60 >60 mL/min   GFR calc Af Amer >60 >60 mL/min    Comment: (NOTE) The eGFR has been calculated using the CKD EPI equation. This calculation has not been  validated in all clinical situations. eGFR's persistently <60 mL/min signify possible Chronic Kidney Disease.    Anion gap 10 5 - 15  Lactic acid, plasma     Status: None   Collection Time: 07/30/15  2:16 PM  Result Value Ref Range   Lactic Acid, Venous 1.0 0.5 - 2.0 mmol/L  HIV antibody     Status: None   Collection Time: 07/30/15  3:44 PM  Result Value Ref Range   HIV Screen 4th Generation wRfx Non Reactive Non Reactive    Comment: (NOTE) Performed At: [facility] [place], [place] [id number] Lindon Romp MD LE:[id number]   CBC     Status: Abnormal   Collection Time: 07/30/15  3:44 PM  Result Value Ref Range   WBC 22.9 (H) 4.0 - 10.5 K/uL   RBC 4.50 3.87 - 5.11 MIL/uL   Hemoglobin 13.4 12.0 - 15.0 g/dL   HCT 41.5 36.0 - 46.0 %   MCV 92.2 78.0 - 100.0 fL   MCH 29.8 26.0 - 34.0 pg   MCHC 32.3 30.0 - 36.0 g/dL   RDW 13.2 11.5 - 15.5 %   Platelets 329 150 - 400 K/uL  Creatinine, serum     Status: None   Collection Time: 07/30/15  3:44  PM  Result Value Ref Range   Creatinine, Ser 0.83 0.44 - 1.00 mg/dL   GFR calc non Af Amer >60 >60 mL/min   GFR calc Af Amer >60 >60 mL/min    Comment: (NOTE) The eGFR has been calculated using the CKD EPI equation. This calculation has not been validated in all clinical situations. eGFR's persistently <60 mL/min signify possible Chronic Kidney Disease.   Basic metabolic panel     Status: Abnormal   Collection Time: 07/31/15  6:18 AM  Result Value Ref Range   Sodium 138 135 - 145 mmol/L   Potassium 5.0 3.5 - 5.1 mmol/L   Chloride 105 101 - 111 mmol/L   CO2 27 22 - 32 mmol/L   Glucose, Bld 129 (H) 65 - 99 mg/dL   BUN 14 6 - 20 mg/dL   Creatinine, Ser 0.74 0.44 - 1.00 mg/dL   Calcium 8.6 (L) 8.9 - 10.3 mg/dL   GFR calc non Af Amer >60 >60 mL/min   GFR calc Af Amer >60 >60 mL/min    Comment: (NOTE) The eGFR has been calculated using the CKD EPI equation. This calculation has not been validated in all clinical situations. eGFR's persistently <60 mL/min signify possible Chronic Kidney Disease.    Anion gap 6 5 - 15  CBC     Status: Abnormal   Collection Time: 07/31/15  6:18 AM  Result Value Ref Range   WBC 14.2 (H) 4.0 - 10.5 K/uL   RBC 4.53 3.87 - 5.11 MIL/uL   Hemoglobin 13.6 12.0 - 15.0 g/dL   HCT 42.5 36.0 - 46.0 %   MCV 93.8 78.0 - 100.0 fL   MCH 30.0 26.0 - 34.0 pg   MCHC 32.0 30.0 - 36.0 g/dL   RDW 13.3 11.5 - 15.5 %   Platelets 289 150 - 400 K/uL    Dg Finger Ring Right  07/30/2015  CLINICAL DATA:  Right ring finger swelling from possible spider bite. Spreading redness. EXAM: RIGHT RING FINGER 2+V COMPARISON:  None. FINDINGS: No soft tissue gas, opaque foreign body, or evidence of osseous infection. Osteopenia. IMPRESSION: Soft tissue swelling without gas or foreign body. No evidence of osseous infection. Electronically Signed  By: Monte Fantasia M.D.   On: 07/30/2015 11:43    Review of Systems  Constitutional: Negative for fever and chills.  Musculoskeletal:  Positive for joint pain. Negative for myalgias.  Neurological: Negative for tingling.   Blood pressure 129/79, pulse 50, temperature 97.5 F (36.4 C), temperature source Oral, resp. rate 18, height _0  (1.575 m), weight 159 lb 11.2 oz (72.439 kg), SpO2 95 %. Physical Exam  Constitutional: She is oriented to person, place, and time. She appears well-nourished.  Eyes: Right eye exhibits no discharge. Left eye exhibits no discharge. No scleral icterus.  Neck: Neck supple. No JVD present. No tracheal deviation present.  Cardiovascular: Intact distal pulses.   Respiratory: Effort normal. No stridor.  Musculoskeletal:  The patient was not observed to ambulate she was in bed resting. The right ring finger has a dorsal area of cellulitis over the PIP joint tracks into the MP joint area as a pustule there is surrounding erythema tenderness and swelling. She has slight decrease in range of motion joint is stable on her home she has macular rash from psoriasis this is also noted on the other side color and perfusion of the finger is normal lymph nodes on that side are negative and there are no sensory deficits in the finger  Neurological: She is alert and oriented to person, place, and time. She has normal reflexes. She exhibits normal muscle tone. Coordination normal.  Skin: Skin is warm and dry. Lesion noted. Rash is not macular. She is not diaphoretic. No cyanosis.  Psychiatric: She has a normal mood and affect. Her behavior is normal. Thought content normal.    Assessment/Plan: Dorsal cellulitis right ring finger with erythema  She has failed one course of oral antibiotics. She received one dose of clindamycin in the ER. I have placed her on that again added soaks to the finger and hand and placed her nothing by mouth in case after drying the finger tomorrow. Stop anticoagulants as well. The white count is down to 14.2 from 22.9  [facility] 07/31/2015, 8:30 AM

## 2015-08-01 ENCOUNTER — Encounter (HOSPITAL_COMMUNITY)
Admission: EM | Disposition: A | Discharge status: Home / Self Care | Payer: Self-pay | Source: Home / Self Care | Attending: Internal Medicine

## 2015-08-01 ENCOUNTER — Inpatient Hospital Stay (HOSPITAL_COMMUNITY): Payer: BLUE CROSS/BLUE SHIELD | Admitting: Anesthesiology

## 2015-08-01 ENCOUNTER — Encounter (HOSPITAL_COMMUNITY): Payer: Self-pay

## 2015-08-01 ENCOUNTER — Inpatient Hospital Stay (HOSPITAL_COMMUNITY): Payer: BLUE CROSS/BLUE SHIELD

## 2015-08-01 DIAGNOSIS — L02511 Cutaneous abscess of right hand: Secondary | ICD-10-CM | POA: Diagnosis present

## 2015-08-01 HISTORY — PX: INCISION AND DRAINAGE: SHX5863

## 2015-08-01 LAB — CBC
HEMATOCRIT: 40.1 % (ref 36.0–46.0)
Hemoglobin: 13.1 g/dL (ref 12.0–15.0)
MCH: 30.4 pg (ref 26.0–34.0)
MCHC: 32.7 g/dL (ref 30.0–36.0)
MCV: 93 fL (ref 78.0–100.0)
Platelets: 261 10*3/uL (ref 150–400)
RBC: 4.31 MIL/uL (ref 3.87–5.11)
RDW: 13.1 % (ref 11.5–15.5)
WBC: 13.8 10*3/uL — ABNORMAL HIGH (ref 4.0–10.5)

## 2015-08-01 LAB — SURGICAL PCR SCREEN
MRSA, PCR: INVALID — AB
Staphylococcus aureus: INVALID — AB

## 2015-08-01 SURGERY — INCISION AND DRAINAGE
Anesthesia: General | Site: Finger | Laterality: Right

## 2015-08-01 MED ORDER — MIDAZOLAM HCL 2 MG/2ML IJ SOLN
INTRAMUSCULAR | Status: AC
  Filled 2015-08-01: qty 4

## 2015-08-01 MED ORDER — SODIUM CHLORIDE 0.9 % IR SOLN
Status: DC | PRN
  Administered 2015-08-01: 1000 mL

## 2015-08-01 MED ORDER — ONDANSETRON HCL 4 MG/2ML IJ SOLN
INTRAMUSCULAR | Status: AC
  Filled 2015-08-01: qty 2

## 2015-08-01 MED ORDER — DEXAMETHASONE SODIUM PHOSPHATE 4 MG/ML IJ SOLN
INTRAMUSCULAR | Status: AC
  Filled 2015-08-01: qty 1

## 2015-08-01 MED ORDER — GLYCOPYRROLATE 0.2 MG/ML IJ SOLN
INTRAMUSCULAR | Status: DC | PRN
  Administered 2015-08-01: 0.2 mg via INTRAVENOUS

## 2015-08-01 MED ORDER — LIDOCAINE HCL 1 % IJ SOLN
INTRAMUSCULAR | Status: DC | PRN
  Administered 2015-08-01: 25 mg via INTRADERMAL

## 2015-08-01 MED ORDER — MIDAZOLAM HCL 5 MG/5ML IJ SOLN
INTRAMUSCULAR | Status: DC | PRN
  Administered 2015-08-01: 2 mg via INTRAVENOUS
  Administered 2015-08-01: 1 mg via INTRAVENOUS

## 2015-08-01 MED ORDER — PROPOFOL 10 MG/ML IV BOLUS
INTRAVENOUS | Status: AC
  Filled 2015-08-01: qty 20

## 2015-08-01 MED ORDER — GLYCOPYRROLATE 0.2 MG/ML IJ SOLN
INTRAMUSCULAR | Status: AC
  Filled 2015-08-01: qty 1

## 2015-08-01 MED ORDER — LACTATED RINGERS IV SOLN
INTRAVENOUS | Status: DC | PRN
  Administered 2015-08-01: 10:00:00 via INTRAVENOUS

## 2015-08-01 MED ORDER — LIDOCAINE HCL (PF) 1 % IJ SOLN
INTRAMUSCULAR | Status: AC
  Filled 2015-08-01: qty 5

## 2015-08-01 MED ORDER — DEXAMETHASONE SODIUM PHOSPHATE 4 MG/ML IJ SOLN
INTRAMUSCULAR | Status: DC | PRN
  Administered 2015-08-01: 4 mg via INTRAVENOUS

## 2015-08-01 MED ORDER — ONDANSETRON HCL 4 MG/2ML IJ SOLN
INTRAMUSCULAR | Status: DC | PRN
  Administered 2015-08-01: 4 mg via INTRAVENOUS

## 2015-08-01 MED ORDER — DEXAMETHASONE SODIUM PHOSPHATE 4 MG/ML IJ SOLN: INTRAMUSCULAR | Status: DC | PRN

## 2015-08-01 MED ORDER — CHLORHEXIDINE GLUCONATE 4 % EX LIQD
60.0000 mL | Freq: Once | CUTANEOUS | Status: DC
Start: 2015-08-01 — End: 2015-08-01

## 2015-08-01 MED ORDER — FENTANYL CITRATE (PF) 100 MCG/2ML IJ SOLN
INTRAMUSCULAR | Status: DC | PRN
  Administered 2015-08-01 (×2): 50 ug via INTRAVENOUS
  Administered 2015-08-01: 100 ug via INTRAVENOUS

## 2015-08-01 MED ORDER — PROPOFOL 10 MG/ML IV BOLUS
INTRAVENOUS | Status: DC | PRN
  Administered 2015-08-01: 130 mg via INTRAVENOUS

## 2015-08-01 MED ORDER — FENTANYL CITRATE (PF) 250 MCG/5ML IJ SOLN
INTRAMUSCULAR | Status: AC
  Filled 2015-08-01: qty 5

## 2015-08-01 MED ORDER — POVIDONE-IODINE 10 % EX SWAB
2.0000 | Freq: Once | CUTANEOUS | Status: DC
Start: 2015-08-01 — End: 2015-08-01

## 2015-08-01 SURGICAL SUPPLY — 42 items
BAG HAMPER (MISCELLANEOUS) ×2 IMPLANT
BANDAGE ELASTIC 2 VELCRO NS LF (GAUZE/BANDAGES/DRESSINGS) ×1 IMPLANT
BANDAGE ELASTIC 3 VELCRO ST LF (GAUZE/BANDAGES/DRESSINGS) IMPLANT
BANDAGE ELASTIC 6 VELCRO ST LF (GAUZE/BANDAGES/DRESSINGS) IMPLANT
BANDAGE ESMARK 4X12 BL STRL LF (DISPOSABLE) ×1 IMPLANT
BNDG CMPR 12X4 ELC STRL LF (DISPOSABLE) ×2
BNDG CONFORM 2 STRL LF (GAUZE/BANDAGES/DRESSINGS) ×1 IMPLANT
BNDG ESMARK 4X12 BLUE STRL LF (DISPOSABLE) ×4
CLOTH BEACON ORANGE TIMEOUT ST (SAFETY) ×2 IMPLANT
COVER LIGHT HANDLE STERIS (MISCELLANEOUS) ×4 IMPLANT
CUFF TOURNIQUET SINGLE 18IN (TOURNIQUET CUFF) ×2 IMPLANT
DRSG ALLEVYN 2X2 (GAUZE/BANDAGES/DRESSINGS) IMPLANT
ELECT REM PT RETURN 9FT ADLT (ELECTROSURGICAL) ×2
ELECTRODE REM PT RTRN 9FT ADLT (ELECTROSURGICAL) ×1 IMPLANT
GAUZE KERLIX 2  STERILE LF (GAUZE/BANDAGES/DRESSINGS) ×1 IMPLANT
GAUZE SPONGE 4X4 12PLY STRL (GAUZE/BANDAGES/DRESSINGS) ×1 IMPLANT
GLOVE BIOGEL PI IND STRL 7.0 (GLOVE) ×1 IMPLANT
GLOVE BIOGEL PI INDICATOR 7.0 (GLOVE) ×2
GLOVE ECLIPSE 6.5 STRL STRAW (GLOVE) ×1 IMPLANT
GLOVE EXAM NITRILE MD LF STRL (GLOVE) ×1 IMPLANT
GLOVE SKINSENSE NS SZ8.0 LF (GLOVE) ×1
GLOVE SKINSENSE STRL SZ8.0 LF (GLOVE) ×1 IMPLANT
GLOVE SS N UNI LF 8.5 STRL (GLOVE) ×2 IMPLANT
GOWN STRL REUS W/TWL LRG LVL3 (GOWN DISPOSABLE) ×5 IMPLANT
GOWN STRL REUS W/TWL XL LVL3 (GOWN DISPOSABLE) ×2 IMPLANT
HAND ALUMI LG (SOFTGOODS) ×1 IMPLANT
HAND ALUMI XLG (SOFTGOODS) ×1 IMPLANT
KIT ROOM TURNOVER APOR (KITS) ×2 IMPLANT
MANIFOLD NEPTUNE II (INSTRUMENTS) ×2 IMPLANT
NS IRRIG 1000ML POUR BTL (IV SOLUTION) ×2 IMPLANT
PACK BASIC LIMB (CUSTOM PROCEDURE TRAY) ×1 IMPLANT
PAD ABD 5X9 TENDERSORB (GAUZE/BANDAGES/DRESSINGS) IMPLANT
PAD ARMBOARD 7.5X6 YLW CONV (MISCELLANEOUS) ×2 IMPLANT
PADDING CAST COTTON 6X4 STRL (CAST SUPPLIES) IMPLANT
SET BASIN LINEN APH (SET/KITS/TRAYS/PACK) ×2 IMPLANT
SPONGE GAUZE 2X2 8PLY STRL LF (GAUZE/BANDAGES/DRESSINGS) ×1 IMPLANT
SPONGE GAUZE 4X4 12PLY (GAUZE/BANDAGES/DRESSINGS) ×1 IMPLANT
SUT ETHILON 3 0 FSL (SUTURE) ×1 IMPLANT
SWAB CULTURE LIQ STUART DBL (MISCELLANEOUS) ×1 IMPLANT
SYR BULB IRRIGATION 50ML (SYRINGE) ×2 IMPLANT
TUBE ANAEROBIC PORT A CUL  W/M (MISCELLANEOUS) ×1 IMPLANT
VESSEL LOOPS MAXI RED (MISCELLANEOUS) IMPLANT

## 2015-08-01 NOTE — Progress Notes (Signed)
[facility] PROGRESS NOTE  Joyce [name] [facility] [id number] DOB: 02/22/1956 DOA: 07/30/2015 PCP: [name], MD  Assessment/Plan: Right ring finger cellulitis -Appears improved today after antibiotic therapy. -Continue IV clindamycin. -For I and D with Dr. Aline Brochure today, further recommendations to follow.  Hyperlipidemia -Continue statin  Anxiety disorder  -continue psychotropic medications  Psoriasis -Continue prednisone taper  Code Status: Full code Family Communication: Daughter at bedside updated on plan of care and all questions answered. Disposition Plan: To be determined pending orthopedics recommendations   Consultants:  Orthopedics   Antibiotics:  Clindamycin   Subjective: No complaints, feels right finger is less swollen  Objective: Filed Vitals:   08/01/15 1025 08/01/15 1030 08/01/15 1035 08/01/15 1040  BP: 117/57 120/64 115/64 110/62  Pulse:      Temp:      TempSrc:      Resp: 13 13 20 13   Height:      Weight:      SpO2: 97% 100% 100% 100%   No intake or output data in the 24 hours ending 08/01/15 1042 Filed Weights   07/30/15 1002 07/30/15 1412 08/01/15 1010  Weight: 72.576 kg (160 lb) 72.439 kg (159 lb 11.2 oz) 72.122 kg (159 lb)    Exam:   General:  Alert, awake, oriented 3  Cardiovascular: Regular rate and rhythm  Respiratory: Clear to auscultation bilaterally  Abdomen: Soft, nontender, nondistended, positive bowel sounds  Extremities: No clubbing, cyanosis or edema, positive pulses   Neurologic:  Grossly intact and nonfocal  Data Reviewed: Basic Metabolic Panel:  Recent Labs Lab 07/30/15 1020 07/30/15 1544 07/31/15 0618  NA 140  --  138  K 4.2  --  5.0  CL 106  --  105  CO2 24  --  27  GLUCOSE 97  --  129*  BUN 12  --  14  CREATININE 0.62 0.83 0.74  CALCIUM 8.9  --  8.6*   Liver Function Tests: No results for input(s): AST, ALT, ALKPHOS, BILITOT, PROT, ALBUMIN in the last 168 hours. No results for  input(s): LIPASE, AMYLASE in the last 168 hours. No results for input(s): AMMONIA in the last 168 hours. CBC:  Recent Labs Lab 07/30/15 1020 07/30/15 1544 07/31/15 0618 08/01/15 0553  WBC 25.2* 22.9* 14.2* 13.8*  NEUTROABS 21.1*  --   --   --   HGB 14.6 13.4 13.6 13.1  HCT 43.8 41.5 42.5 40.1  MCV 90.9 92.2 93.8 93.0  PLT 371 329 289 261   Cardiac Enzymes: No results for input(s): CKTOTAL, CKMB, CKMBINDEX, TROPONINI in the last 168 hours. BNP (last 3 results) No results for input(s): BNP in the last 8760 hours.  ProBNP (last 3 results) No results for input(s): PROBNP in the last 8760 hours.  CBG: No results for input(s): GLUCAP in the last 168 hours.  Recent Results (from the past 240 hour(s))  Culture, blood (routine x 2)     Status: None (Preliminary result)   Collection Time: 07/30/15  3:44 PM  Result Value Ref Range Status   Specimen Description BLOOD RIGHT ARM  Final   Special Requests   Final    BOTTLES DRAWN AEROBIC AND ANAEROBIC AEB=7CC ANA=6CC   Culture NO GROWTH < 24 HOURS  Final   Report Status PENDING  Incomplete  Culture, blood (routine x 2)     Status: None (Preliminary result)   Collection Time: 07/30/15  3:48 PM  Result Value Ref Range Status   Specimen Description BLOOD RIGHT HAND  Final   Special Requests   Final    BOTTLES DRAWN AEROBIC AND ANAEROBIC AEB=6CC ANA=5CC   Culture NO GROWTH < 24 HOURS  Final   Report Status PENDING  Incomplete     Studies: Dg Chest Port 1 View  08/01/2015  CLINICAL DATA:  Preoperative chest. EXAM: PORTABLE CHEST 1 VIEW COMPARISON:  None. FINDINGS: The heart, hila, mediastinum, lungs, and pleura are normal. No pneumothorax. IMPRESSION: No active disease. Electronically Signed   By: Dorise Bullion III M.D   On: 08/01/2015 09:45   Dg Finger Ring Right  07/30/2015  CLINICAL DATA:  Right ring finger swelling from possible spider bite. Spreading redness. EXAM: RIGHT RING FINGER 2+V COMPARISON:  None. FINDINGS: No soft tissue  gas, opaque foreign body, or evidence of osseous infection. Osteopenia. IMPRESSION: Soft tissue swelling without gas or foreign body. No evidence of osseous infection. Electronically Signed   By: Monte Fantasia M.D.   On: 07/30/2015 11:43    Scheduled Meds: . [MAR Hold] celecoxib  200 mg Oral Daily  . chlorhexidine  60 mL Topical Once  . [MAR Hold] clindamycin (CLEOCIN) IV  600 mg Intravenous Q8H  . [MAR Hold] magnesium sulfate  1 g Other QID  . povidone-iodine  2 application Topical Once  . [MAR Hold] predniSONE  40 mg Oral QHS  . [MAR Hold] rosuvastatin  20 mg Oral q1800  . [MAR Hold] sertraline  50 mg Oral Daily   Continuous Infusions: . sodium chloride 75 mL/hr at 07/31/15 2028    Principal Problem:   Cellulitis of ring finger Active Problems:   Hypercholesteremia   Anxiety   Cellulitis of hand    Time spent: 25 minutes. Greater than 50% of this time was spent in direct contact with the patient coordinating care.    Joyce [name]  [facility] Pager [phone]  If 7PM-7AM, please contact night-coverage at [email or web address], password [facility] 08/01/2015, 10:42 AM  LOS: 1 day

## 2015-08-01 NOTE — Transfer of Care (Signed)
Immediate Anesthesia Transfer of Care Note  Patient: Joyce [name] [facility]  Procedure(s) Performed: Procedure(s): INCISION AND DRAINAGE RIGHT RING FINGER (Right)  Patient Location: PACU  Anesthesia Type:General  Level of Consciousness: awake and patient cooperative  Airway & Oxygen Therapy: Patient Spontanous Breathing and Patient connected to face mask oxygen  Post-op Assessment: Report given to RN, Post -op Vital signs reviewed and stable and Patient moving all extremities  Post vital signs: Reviewed and stable  Last Vitals:  Filed Vitals:   08/01/15 1045 08/01/15 1050  BP: 117/61 104/67  Pulse:    Temp:    Resp: 14 12    Complications: No apparent anesthesia complications

## 2015-08-01 NOTE — Brief Op Note (Addendum)
07/30/2015 - 08/01/2015  11:28 AM  PATIENT:  Joyce [name]  60 y.o. female  PRE-OPERATIVE DIAGNOSIS:  Cellulitis right ring finger possible abscess  POST-OPERATIVE DIAGNOSIS:  Cellulitis right ring finger with abscess  PROCEDURE:  Procedure(s) with comments: INCISION AND DRAINAGE right ring finger (Right) - 11 AM surgery  SURGEON:  Surgeon(s) and Role:    * Carole Civil, MD - Primary  PHYSICIAN ASSISTANT:   ASSISTANTS: none   ANESTHESIA:   general LMA EBL:  Total I/O In: 500 [I.V.:500] Out: -   BLOOD ADMINISTERED:none  DRAINS: none   LOCAL MEDICATIONS USED:  NONE  SPECIMEN:  Source of Specimen:  Dorsal aspect right ring finger at the proximal phalanx  DISPOSITION OF SPECIMEN:  Microbiology  COUNTS:  YES  TOURNIQUET:   Total Tourniquet Time Documented: Upper Arm (Right) - 8 minutes Total: Upper Arm (Right) - 8 minutes   DICTATION: .Viviann Spare Dictation  PLAN OF CARE: Admit to inpatient   PATIENT DISPOSITION:  PACU - hemodynamically stable.   Delay start of Pharmacological VTE agent (>24hrs) due to surgical blood loss or risk of bleeding: not applicable  The procedure was done as follows  In the preop area site marking and site identification were completed. She was then taken to the surgical suite  She had an LMA anesthetic that went well  She was prepped with Betadine and draped sterilely  Timeout was completed  May the incision at the point of maximal erythema opened it up found gross purulence superficial to the extensor tendon. We cultured the wound anaerobic and aerobic. We used a hemostat to dissect bluntly around the area proximally distally medial lateral inferior superior found no other purulence  We irrigated thoroughly and packed the wound with a moist 2 x 2 saline dressing dry 1 over the top then we placed a gauze wrap and an Ace bandage  Postop plan  Wet-to-dry dressing changes daily  Continue antibiotics orally and then change  antibiotics if necessary based on culture  Follow-up with me on the 19th

## 2015-08-01 NOTE — Progress Notes (Signed)
Orthopaedic follow up   BP 144/83 mmHg  Pulse 67  Temp(Src) 97.6 F (36.4 C) (Oral)  Resp 20  Ht 5\' 2"  (1.575 m)  Wt 159 lb 11.2 oz (72.439 kg)  BMI 29.20 kg/m2  SpO2 98%  Drainage noticed yesterday  Redness now localized to PIP joint to MCP with tenderness to the wrist   Range of motion is still diminished she cannot fully close the hand.  She agrees that this should be opened up so we will proceed with incision and drainage at 11 AM

## 2015-08-01 NOTE — Op Note (Signed)
07/30/2015 - 08/01/2015  11:28 AM  PATIENT:  Joyce [name]  60 y.o. female  PRE-OPERATIVE DIAGNOSIS:  Cellulitis right ring finger possible abscess  POST-OPERATIVE DIAGNOSIS:  Cellulitis right ring finger with abscess  PROCEDURE:  Procedure(s) with comments: INCISION AND DRAINAGE right ring finger (Right) - 11 AM surgery  SURGEON:  Surgeon(s) and Role:    * Carole Civil, MD - Primary  PHYSICIAN ASSISTANT:   ASSISTANTS: none   ANESTHESIA:   general LMA EBL:  Total I/O In: 500 [I.V.:500] Out: -   BLOOD ADMINISTERED:none  DRAINS: none   LOCAL MEDICATIONS USED:  NONE  SPECIMEN:  Source of Specimen:  Dorsal aspect right ring finger at the proximal phalanx  DISPOSITION OF SPECIMEN:  Microbiology  COUNTS:  YES  TOURNIQUET:   Total Tourniquet Time Documented: Upper Arm (Right) - 8 minutes Total: Upper Arm (Right) - 8 minutes   DICTATION: .Viviann Spare Dictation  PLAN OF CARE: Admit to inpatient   PATIENT DISPOSITION:  PACU - hemodynamically stable.   Delay start of Pharmacological VTE agent (>24hrs) due to surgical blood loss or risk of bleeding: not applicable  The procedure was done as follows  In the preop area site marking and site identification were completed. She was then taken to the surgical suite  She had an LMA anesthetic that went well  She was prepped with Betadine and draped sterilely  Timeout was completed  May the incision at the point of maximal erythema opened it up found gross purulence superficial to the extensor tendon. We cultured the wound anaerobic and aerobic. We used a hemostat to dissect bluntly around the area proximally distally medial lateral inferior superior found no other purulence  We irrigated thoroughly and packed the wound with a moist 2 x 2 saline dressing dry 1 over the top then we placed a gauze wrap and an Ace bandage  Postop plan  Wet-to-dry dressing changes daily  Continue antibiotics orally and then change  antibiotics if necessary based on culture  Follow-up with me on the 19th

## 2015-08-01 NOTE — Anesthesia Preprocedure Evaluation (Addendum)
Anesthesia Evaluation   Patient awake    Reviewed: Allergy & Precautions, NPO status , Patient's Chart, lab work & pertinent test results  Airway Mallampati: II  TM Distance: >3 FB     Dental  (+) Upper Dentures   Pulmonary neg pulmonary ROS, former smoker,    breath sounds clear to auscultation       Cardiovascular Exercise Tolerance: Good  Rhythm:Regular Rate:Normal     Neuro/Psych    GI/Hepatic   Endo/Other  Morbid obesity  Renal/GU      Musculoskeletal   Abdominal (+) + obese,  Abdomen: soft. Bowel sounds: normal.  Peds  Hematology   Anesthesia Other Findings   Reproductive/Obstetrics                           Anesthesia Physical Anesthesia Plan  ASA: II and emergent  Anesthesia Plan: General   Post-op Pain Management:    Induction: Intravenous  Airway Management Planned: LMA  Additional Equipment:   Intra-op Plan:   Post-operative Plan: Extubation in OR  Informed Consent:   Plan Discussed with: CRNA and Anesthesiologist  Anesthesia Plan Comments:         Anesthesia Quick Evaluation

## 2015-08-01 NOTE — Anesthesia Postprocedure Evaluation (Signed)
Anesthesia Post Note  Patient: Joyce [name]  Procedure(s) Performed: Procedure(s) (LRB): INCISION AND DRAINAGE RIGHT RING FINGER (Right)  Patient location during evaluation: PACU Anesthesia Type: General Level of consciousness: awake and alert, oriented and patient cooperative Pain management: pain level controlled Vital Signs Assessment: post-procedure vital signs reviewed and stable Respiratory status: spontaneous breathing Cardiovascular status: blood pressure returned to baseline and stable Postop Assessment: adequate PO intake and no signs of nausea or vomiting Anesthetic complications: no    Last Vitals:  Filed Vitals:   08/01/15 1050 08/01/15 1139  BP: 104/67   Pulse:    Temp:  36.6 C  Resp: 12     Last Pain:  Filed Vitals:   08/01/15 1156  PainSc: 5                  Argenis Kumari J

## 2015-08-01 NOTE — Anesthesia Procedure Notes (Signed)
Procedure Name: LMA Insertion Date/Time: 08/01/2015 11:05 AM Performed by: Charmaine Downs Pre-anesthesia Checklist: Patient identified, Emergency Drugs available, Suction available and Patient being monitored Patient Re-evaluated:Patient Re-evaluated prior to inductionOxygen Delivery Method: Circle system utilized Preoxygenation: Pre-oxygenation with 100% oxygen Intubation Type: IV induction Ventilation: Mask ventilation without difficulty LMA: LMA inserted LMA Size: 4.0 Grade View: Grade II Number of attempts: 1 Placement Confirmation: breath sounds checked- equal and bilateral and positive ETCO2 Tube secured with: Tape Dental Injury: Teeth and Oropharynx as per pre-operative assessment

## 2015-08-02 LAB — MRSA CULTURE

## 2015-08-02 LAB — CBC
HCT: 39.5 % (ref 36.0–46.0)
HEMOGLOBIN: 12.6 g/dL (ref 12.0–15.0)
MCH: 30.2 pg (ref 26.0–34.0)
MCHC: 31.9 g/dL (ref 30.0–36.0)
MCV: 94.7 fL (ref 78.0–100.0)
Platelets: 276 10*3/uL (ref 150–400)
RBC: 4.17 MIL/uL (ref 3.87–5.11)
RDW: 13.2 % (ref 11.5–15.5)
WBC: 18.2 10*3/uL — ABNORMAL HIGH (ref 4.0–10.5)

## 2015-08-02 MED ORDER — OXYCODONE HCL 5 MG PO TABS: 5.0000 mg | ORAL_TABLET | ORAL | Status: DC | PRN

## 2015-08-02 MED ORDER — SULFAMETHOXAZOLE-TRIMETHOPRIM 800-160 MG PO TABS: 1.0000 | ORAL_TABLET | Freq: Two times a day (BID) | ORAL | Status: DC

## 2015-08-02 NOTE — Anesthesia Postprocedure Evaluation (Signed)
Anesthesia Post Note  Patient: Joyce [name]  Procedure(s) Performed: Procedure(s) (LRB): INCISION AND DRAINAGE RIGHT RING FINGER (Right)  Patient location during evaluation: Nursing Unit Anesthesia Type: General Level of consciousness: awake and alert, oriented and patient cooperative Pain management: pain level controlled Vital Signs Assessment: post-procedure vital signs reviewed and stable Respiratory status: spontaneous breathing, nonlabored ventilation and respiratory function stable Cardiovascular status: blood pressure returned to baseline Postop Assessment: no signs of nausea or vomiting and adequate PO intake Anesthetic complications: no    Last Vitals:  Filed Vitals:   08/01/15 2105 08/02/15 0652  BP: 158/80 142/87  Pulse: 57 67  Temp: 36.6 C 36.7 C  Resp: 20 20    Last Pain:  Filed Vitals:   08/02/15 0951  PainSc: 4                  Dayra Rapley J

## 2015-08-02 NOTE — Progress Notes (Signed)
Patient discharged home.  Reviewed DC instructions and medications.  Verbalizes understanding.  Dressing changed prior to DC, instructed family member in dressing changes who will be assisting at home.  No questions at this time.  Stable at time of DC, assisted off unit by staff.

## 2015-08-02 NOTE — Addendum Note (Signed)
Addendum  created 08/02/15 1200 by Charmaine Downs, CRNA   Modules edited: Clinical Notes   Clinical Notes:  File: [id number]

## 2015-08-02 NOTE — Discharge Summary (Signed)
Physician Discharge Summary  Joyce [name] [facility] [id number] DOB: 1955-10-25 DOA: 07/30/2015  PCP: [name], MD  Admit date: 07/30/2015 Discharge date: 08/02/2015  Time spent: 45 minutes  Recommendations for Outpatient Follow-up:  -Will be discharged home today. -Has follow-up appointment with Dr. Aline Brochure on April 19.   Discharge Diagnoses:  Principal Problem:   Cellulitis of ring finger Active Problems:   Hypercholesteremia   Anxiety   Cellulitis of hand   Abscess of finger of right hand   Discharge Condition: Stable and improved  Filed Weights   07/30/15 1002 07/30/15 1412 08/01/15 1010  Weight: 72.576 kg (160 lb) 72.439 kg (159 lb 11.2 oz) 72.122 kg (159 lb)    History of present illness:  Patient is a 60 year old woman with history of psoriasis currently under prednisone taper, hyperlipidemia who presents to the hospital today with the above complaints. She states that 4 days prior to admission she noticed a "pimple" over her right ring finger that she thought may have been a spider bite. She picked at it and had a small amount of pus that was released. She has subsequently noticed that her finger has gotten progressively red and swollen. She saw her PCP yesterday who prescribed her a course of Bactrim. She took 1 dose last night however when she woke up this morning the erythema and edema was much worse prompting her visit to the emergency department. She was found to have a white blood cell count of 25.2, has been afebrile denies chills. EDP has consulted with orthopedics, Dr. Aline Brochure who advises patient should be okay to admit to [facility] and he will follow. We are asked to admit her for further evaluation and management.  Hospital Course:   Right ring finger cellulitis -Status post I and D on 4/8. -Discharged on an 8 day course of Bactrim -Follow-up with Dr. Aline Brochure as scheduled on April 19.  Hyperlipidemia -Continue statin  Anxiety disorder  -continue  psychotropic medications  Psoriasis -Continue prednisone taper  Procedures:  Right ring finger I and D   Consultations:  Orthopedics, Dr. Aline Brochure  Discharge Instructions  Discharge Instructions    Diet - low sodium heart healthy    Complete by:  As directed      Increase activity slowly    Complete by:  As directed             Medication List    TAKE these medications        ALPRAZolam 1 MG tablet  Commonly known as:  XANAX  Take 1 mg by mouth 3 (three) times daily as needed for anxiety ((confirmed with [facility])).     celecoxib 200 MG capsule  Commonly known as:  CELEBREX  Take 200 mg by mouth daily.     oxyCODONE 5 MG immediate release tablet  Commonly known as:  Oxy IR/ROXICODONE  Take 1 tablet (5 mg total) by mouth every 4 (four) hours as needed for moderate pain.     predniSONE 10 MG (48) Tbpk tablet  Commonly known as:  STERAPRED UNI-PAK 48 TAB  TAKE AS DIRECTED WITH FOOD PER PACKAGE DIRECTIONS ORALLY     rosuvastatin 20 MG tablet  Commonly known as:  CRESTOR  Take 20 mg by mouth daily.     sertraline 50 MG tablet  Commonly known as:  ZOLOFT  Take 50 mg by mouth daily.     SLEEP AID PO  Take 1 tablet by mouth at bedtime.     sulfamethoxazole-trimethoprim  800-160 MG tablet  Commonly known as:  BACTRIM DS,SEPTRA DS  Take 1 tablet by mouth 2 (two) times daily.     Vitamin D (Ergocalciferol) 50000 units Caps capsule  Commonly known as:  DRISDOL  TAKE 1 CAPSULE BY MOUTH WEEKLY       No Known Allergies     Follow-up Information    Follow up with Arther Abbott, MD.   Specialties:  Orthopedic Surgery, Radiology   Why:  as scheduled by his office   Contact information:   [address] [place] [address] [phone]        The results of significant diagnostics from this hospitalization (including imaging, microbiology, ancillary and laboratory) are listed below for reference.    Significant Diagnostic Studies: Dg Chest  Port 1 View  08/01/2015  CLINICAL DATA:  Preoperative chest. EXAM: PORTABLE CHEST 1 VIEW COMPARISON:  None. FINDINGS: The heart, hila, mediastinum, lungs, and pleura are normal. No pneumothorax. IMPRESSION: No active disease. Electronically Signed   By: Dorise Bullion III M.D   On: 08/01/2015 09:45   Dg Finger Ring Right  07/30/2015  CLINICAL DATA:  Right ring finger swelling from possible spider bite. Spreading redness. EXAM: RIGHT RING FINGER 2+V COMPARISON:  None. FINDINGS: No soft tissue gas, opaque foreign body, or evidence of osseous infection. Osteopenia. IMPRESSION: Soft tissue swelling without gas or foreign body. No evidence of osseous infection. Electronically Signed   By: Monte Fantasia M.D.   On: 07/30/2015 11:43    Microbiology: Recent Results (from the past 240 hour(s))  Culture, blood (routine x 2)     Status: None (Preliminary result)   Collection Time: 07/30/15  3:44 PM  Result Value Ref Range Status   Specimen Description BLOOD RIGHT ARM  Final   Special Requests   Final    BOTTLES DRAWN AEROBIC AND ANAEROBIC AEB=7CC ANA=6CC   Culture NO GROWTH 3 DAYS  Final   Report Status PENDING  Incomplete  Culture, blood (routine x 2)     Status: None (Preliminary result)   Collection Time: 07/30/15  3:48 PM  Result Value Ref Range Status   Specimen Description BLOOD RIGHT HAND  Final   Special Requests   Final    BOTTLES DRAWN AEROBIC AND ANAEROBIC AEB=6CC ANA=5CC   Culture NO GROWTH 3 DAYS  Final   Report Status PENDING  Incomplete  Surgical pcr screen     Status: Abnormal   Collection Time: 08/01/15  9:03 AM  Result Value Ref Range Status   MRSA, PCR INVALID RESULTS, SPECIMEN SENT FOR CULTURE (A) NEGATIVE Final    Comment: RESULT CALLED TO, READ BACK BY AND VERIFIED WITH: BULLINS M. AT 1350 ON [id number] BY THOMPSON S.    Staphylococcus aureus INVALID RESULTS, SPECIMEN SENT FOR CULTURE (A) NEGATIVE Final    Comment: RESULT CALLED TO, READ BACK BY AND VERIFIED WITH: BULLINS M.  AT 1350 ON [id number] BY THOMPSON S.        The Xpert SA Assay (FDA approved for NASAL specimens in patients over 42 years of age), is one component of a comprehensive surveillance program.  Test performance has been validated by [facility] for patients greater than or equal to 66 year old. It is not intended to diagnose infection nor to guide or monitor treatment.      Labs: Basic Metabolic Panel:  Recent Labs Lab 07/30/15 1020 07/30/15 1544 07/31/15 0618  NA 140  --  138  K 4.2  --  5.0  CL 106  --  105  CO2 24  --  27  GLUCOSE 97  --  129*  BUN 12  --  14  CREATININE 0.62 0.83 0.74  CALCIUM 8.9  --  8.6*   Liver Function Tests: No results for input(s): AST, ALT, ALKPHOS, BILITOT, PROT, ALBUMIN in the last 168 hours. No results for input(s): LIPASE, AMYLASE in the last 168 hours. No results for input(s): AMMONIA in the last 168 hours. CBC:  Recent Labs Lab 07/30/15 1020 07/30/15 1544 07/31/15 0618 08/01/15 0553  WBC 25.2* 22.9* 14.2* 13.8*  NEUTROABS 21.1*  --   --   --   HGB 14.6 13.4 13.6 13.1  HCT 43.8 41.5 42.5 40.1  MCV 90.9 92.2 93.8 93.0  PLT 371 329 289 261   Cardiac Enzymes: No results for input(s): CKTOTAL, CKMB, CKMBINDEX, TROPONINI in the last 168 hours. BNP: BNP (last 3 results) No results for input(s): BNP in the last 8760 hours.  ProBNP (last 3 results) No results for input(s): PROBNP in the last 8760 hours.  CBG: No results for input(s): GLUCAP in the last 168 hours.     SignedLelon Frohlich  [facility] Pager: [phone] 08/02/2015, 11:38 AM

## 2015-08-04 ENCOUNTER — Encounter (HOSPITAL_COMMUNITY): Payer: Self-pay | Admitting: Orthopedic Surgery

## 2015-08-04 LAB — CULTURE, BLOOD (ROUTINE X 2)
CULTURE: NO GROWTH
Culture: NO GROWTH

## 2015-08-04 LAB — WOUND CULTURE

## 2015-08-06 LAB — ANAEROBIC CULTURE: Gram Stain: NONE SEEN

## 2015-08-12 ENCOUNTER — Ambulatory Visit (INDEPENDENT_AMBULATORY_CARE_PROVIDER_SITE_OTHER): Payer: BLUE CROSS/BLUE SHIELD | Admitting: Orthopedic Surgery

## 2015-08-12 ENCOUNTER — Encounter: Payer: Self-pay | Admitting: Orthopedic Surgery

## 2015-08-12 VITALS — BP 136/86 | Ht 62.0 in | Wt 159.0 lb

## 2015-08-12 DIAGNOSIS — Z4789 Encounter for other orthopedic aftercare: Secondary | ICD-10-CM

## 2015-08-12 MED ORDER — CLINDAMYCIN HCL 300 MG PO CAPS: 300.0000 mg | ORAL_CAPSULE | Freq: Three times a day (TID) | ORAL | Status: DC

## 2015-08-12 MED ORDER — OXYCODONE HCL 5 MG PO TABS: 5.0000 mg | ORAL_TABLET | Freq: Four times a day (QID) | ORAL | Status: DC | PRN

## 2015-08-12 NOTE — Patient Instructions (Signed)
oow note x 3 weeks

## 2015-08-13 NOTE — Progress Notes (Signed)
Follow-up visit status post incision and drainage of the right ring finger over the dorsal portion. She had positive MRSA cultures. She could not take the medicine Bactrim because it gave her oral thrush  Culture results are reviewed again and the bacteria we isolated does respond to clindamycin so we'll start at 300 mg 3 times a day  She's doing well home dressings. Her wound is just about closed but it does have some surrounding erythema and we will restart antibiotics I'll check her weekly. She can use Percocet 5 mg every 6 for pain #42  Addendum she is moving her finger very well

## 2015-08-17 ENCOUNTER — Other Ambulatory Visit: Payer: Self-pay | Admitting: *Deleted

## 2015-08-17 ENCOUNTER — Telehealth: Payer: Self-pay | Admitting: Orthopedic Surgery

## 2015-08-17 MED ORDER — CEPHALEXIN 500 MG PO CAPS: 500.0000 mg | ORAL_CAPSULE | Freq: Two times a day (BID) | ORAL | Status: DC

## 2015-08-17 MED ORDER — DIPHENHYDRAMINE HCL 25 MG PO CAPS: 25.0000 mg | ORAL_CAPSULE | Freq: Three times a day (TID) | ORAL | Status: DC

## 2015-08-17 NOTE — Telephone Encounter (Signed)
Medications sent to pharmacy , patient aware

## 2015-08-17 NOTE — Telephone Encounter (Signed)
Routing to Dr. Harrison to advise 

## 2015-08-17 NOTE — Telephone Encounter (Signed)
Prescribe 25 mg benadryl tid x 5 days  And 500 mg keflex bid x 7 days

## 2015-08-17 NOTE — Telephone Encounter (Signed)
Joyce [name] left a message on the phone over the weekend. She states that she has a rash and her face is swollen from the antibiotic that Dr. Aline Brochure prescribed her. She would like someone to give her a call and see if Dr. Aline Brochure would write a prescription for some other antibiotic.

## 2015-08-21 ENCOUNTER — Encounter: Payer: Self-pay | Admitting: Orthopedic Surgery

## 2015-08-21 ENCOUNTER — Ambulatory Visit (INDEPENDENT_AMBULATORY_CARE_PROVIDER_SITE_OTHER): Payer: BLUE CROSS/BLUE SHIELD | Admitting: Orthopedic Surgery

## 2015-08-21 VITALS — BP 120/84 | Ht 62.0 in | Wt 159.0 lb

## 2015-08-21 DIAGNOSIS — Z4789 Encounter for other orthopedic aftercare: Secondary | ICD-10-CM

## 2015-08-21 MED ORDER — OXYCODONE HCL 5 MG PO TABS: 5.0000 mg | ORAL_TABLET | Freq: Four times a day (QID) | ORAL | Status: DC | PRN

## 2015-08-21 NOTE — Addendum Note (Signed)
Addended by: Carole Civil on: 08/21/2015 10:18 AM   Modules accepted: Orders

## 2015-08-21 NOTE — Progress Notes (Signed)
Chief Complaint  Patient presents with  . Follow-up    recheck on right ring finger, dos 08-01-15    Incision and drainage dorsal aspect right ring finger. Currently on Keflex which is not causing her any side effects as the Bactrim and clindamycin did apparently she got thrush and she also was itching  Her finger looks great she has full range of motion of the wound is closed she has no residual deficits he can return to work on Monday follow-up if any issues arise

## 2015-08-21 NOTE — Patient Instructions (Signed)
Return to work Monday  Finish antibiotics  Call us if any wound problems

## 2015-08-24 DIAGNOSIS — Z7189 Other specified counseling: Secondary | ICD-10-CM | POA: Diagnosis not present

## 2015-08-24 DIAGNOSIS — J309 Allergic rhinitis, unspecified: Secondary | ICD-10-CM | POA: Diagnosis not present

## 2015-09-18 DIAGNOSIS — E559 Vitamin D deficiency, unspecified: Secondary | ICD-10-CM | POA: Diagnosis not present

## 2015-09-23 DIAGNOSIS — E559 Vitamin D deficiency, unspecified: Secondary | ICD-10-CM | POA: Diagnosis not present

## 2015-09-28 DIAGNOSIS — E785 Hyperlipidemia, unspecified: Secondary | ICD-10-CM | POA: Diagnosis not present

## 2015-09-28 DIAGNOSIS — M19041 Primary osteoarthritis, right hand: Secondary | ICD-10-CM | POA: Diagnosis not present

## 2015-09-28 DIAGNOSIS — F419 Anxiety disorder, unspecified: Secondary | ICD-10-CM | POA: Diagnosis not present

## 2015-09-28 DIAGNOSIS — Z87440 Personal history of urinary (tract) infections: Secondary | ICD-10-CM | POA: Diagnosis not present

## 2015-11-02 DIAGNOSIS — I10 Essential (primary) hypertension: Secondary | ICD-10-CM | POA: Diagnosis not present

## 2015-11-02 DIAGNOSIS — F419 Anxiety disorder, unspecified: Secondary | ICD-10-CM | POA: Diagnosis not present

## 2015-11-02 DIAGNOSIS — L409 Psoriasis, unspecified: Secondary | ICD-10-CM | POA: Diagnosis not present

## 2015-11-06 DIAGNOSIS — M25421 Effusion, right elbow: Secondary | ICD-10-CM | POA: Diagnosis not present

## 2015-11-06 DIAGNOSIS — T148 Other injury of unspecified body region: Secondary | ICD-10-CM | POA: Diagnosis not present

## 2015-11-25 DIAGNOSIS — Z79899 Other long term (current) drug therapy: Secondary | ICD-10-CM | POA: Diagnosis not present

## 2015-12-02 DIAGNOSIS — L409 Psoriasis, unspecified: Secondary | ICD-10-CM | POA: Diagnosis not present

## 2015-12-02 DIAGNOSIS — Z79899 Other long term (current) drug therapy: Secondary | ICD-10-CM | POA: Diagnosis not present

## 2015-12-07 DIAGNOSIS — F419 Anxiety disorder, unspecified: Secondary | ICD-10-CM | POA: Diagnosis not present

## 2015-12-07 DIAGNOSIS — Z1389 Encounter for screening for other disorder: Secondary | ICD-10-CM | POA: Diagnosis not present

## 2015-12-07 DIAGNOSIS — E559 Vitamin D deficiency, unspecified: Secondary | ICD-10-CM | POA: Diagnosis not present

## 2015-12-07 DIAGNOSIS — L409 Psoriasis, unspecified: Secondary | ICD-10-CM | POA: Diagnosis not present

## 2015-12-07 DIAGNOSIS — Z008 Encounter for other general examination: Secondary | ICD-10-CM | POA: Diagnosis not present

## 2015-12-07 DIAGNOSIS — E785 Hyperlipidemia, unspecified: Secondary | ICD-10-CM | POA: Diagnosis not present

## 2015-12-07 DIAGNOSIS — I10 Essential (primary) hypertension: Secondary | ICD-10-CM | POA: Diagnosis not present

## 2015-12-14 DIAGNOSIS — L400 Psoriasis vulgaris: Secondary | ICD-10-CM | POA: Diagnosis not present

## 2016-01-06 DIAGNOSIS — G8929 Other chronic pain: Secondary | ICD-10-CM | POA: Diagnosis not present

## 2016-01-06 DIAGNOSIS — B351 Tinea unguium: Secondary | ICD-10-CM | POA: Diagnosis not present

## 2016-01-06 DIAGNOSIS — F419 Anxiety disorder, unspecified: Secondary | ICD-10-CM | POA: Diagnosis not present

## 2016-01-06 DIAGNOSIS — I10 Essential (primary) hypertension: Secondary | ICD-10-CM | POA: Diagnosis not present

## 2016-01-13 DIAGNOSIS — Z79899 Other long term (current) drug therapy: Secondary | ICD-10-CM | POA: Diagnosis not present

## 2016-01-18 DIAGNOSIS — Z79899 Other long term (current) drug therapy: Secondary | ICD-10-CM | POA: Diagnosis not present

## 2016-01-25 DIAGNOSIS — L400 Psoriasis vulgaris: Secondary | ICD-10-CM | POA: Diagnosis not present

## 2016-02-02 DIAGNOSIS — Z23 Encounter for immunization: Secondary | ICD-10-CM | POA: Diagnosis not present

## 2016-02-05 DIAGNOSIS — Z79899 Other long term (current) drug therapy: Secondary | ICD-10-CM | POA: Diagnosis not present

## 2016-02-05 DIAGNOSIS — I10 Essential (primary) hypertension: Secondary | ICD-10-CM | POA: Diagnosis not present

## 2016-02-05 DIAGNOSIS — M25511 Pain in right shoulder: Secondary | ICD-10-CM | POA: Diagnosis not present

## 2016-02-05 DIAGNOSIS — B351 Tinea unguium: Secondary | ICD-10-CM | POA: Diagnosis not present

## 2016-02-05 DIAGNOSIS — F419 Anxiety disorder, unspecified: Secondary | ICD-10-CM | POA: Diagnosis not present

## 2016-02-08 ENCOUNTER — Other Ambulatory Visit: Payer: Self-pay | Admitting: Obstetrics and Gynecology

## 2016-02-08 DIAGNOSIS — Z1231 Encounter for screening mammogram for malignant neoplasm of breast: Secondary | ICD-10-CM

## 2016-03-01 ENCOUNTER — Ambulatory Visit (INDEPENDENT_AMBULATORY_CARE_PROVIDER_SITE_OTHER): Payer: BLUE CROSS/BLUE SHIELD

## 2016-03-01 ENCOUNTER — Ambulatory Visit (INDEPENDENT_AMBULATORY_CARE_PROVIDER_SITE_OTHER): Payer: BLUE CROSS/BLUE SHIELD | Admitting: Orthopedic Surgery

## 2016-03-01 ENCOUNTER — Encounter: Payer: Self-pay | Admitting: Orthopedic Surgery

## 2016-03-01 VITALS — BP 120/77 | HR 91 | Wt 167.0 lb

## 2016-03-01 DIAGNOSIS — M75101 Unspecified rotator cuff tear or rupture of right shoulder, not specified as traumatic: Secondary | ICD-10-CM | POA: Diagnosis not present

## 2016-03-01 DIAGNOSIS — M25511 Pain in right shoulder: Secondary | ICD-10-CM

## 2016-03-01 MED ORDER — TRAMADOL-ACETAMINOPHEN 37.5-325 MG PO TABS: 1.0000 | ORAL_TABLET | Freq: Four times a day (QID) | ORAL | 0 refills | Status: DC | PRN

## 2016-03-01 NOTE — Patient Instructions (Addendum)
You have received an injection of steroids into the joint. 15% of patients will have increased pain within the 24 hours postinjection.   This is transient and will go away.   We recommend that you use ice packs on the injection site for 20 minutes every 2 hours and extra strength Tylenol 2 tablets every 8 as needed until the pain resolves.  If you continue to have pain after taking the Tylenol and using the ice please call the office for further instructions.  Home exercises for 4 weeks

## 2016-03-01 NOTE — Progress Notes (Signed)
Patient ID: Joyce [name] [facility], female   DOB: 08/22/1955, 60 y.o.   MRN: [id number]  Chief Complaint  Patient presents with  . Shoulder Pain    RIGHT SHOULDER PAIN    HPI Joyce [name] is a 60 y.o. female.  Presents with pain in her right shoulder. She says she has to use her right arm above her head at work and she's done at for several years now and presents with right shoulder pain. Deltoid region associated with night pain forward elevation without trauma. Pain present now for several weeks  Review of Systems Review of Systems  Denies chest pain shortness of breath  Past Medical History:  Diagnosis Date  . Cancer ([place])   . Headache(784.0)   . Hypercholesteremia     Past Surgical History:  Procedure Laterality Date  . ABDOMINAL HYSTERECTOMY    . COLONOSCOPY  04/05/2012   Procedure: COLONOSCOPY;  Surgeon: Rogene Houston, MD;  Location: [facility]O [facility];  Service: Endoscopy;  Laterality: N/A;  930  . INCISION AND DRAINAGE Right 08/01/2015   Procedure: INCISION AND DRAINAGE RIGHT RING FINGER;  Surgeon: Carole Civil, MD;  Location: [facility] ORS;  Service: Orthopedics;  Laterality: Right;    Social History Social History  Substance Use Topics  . Smoking status: Former Smoker    Packs/day: 1.00    Years: 44.00  . Smokeless tobacco: Not on file  . Alcohol use No    No Known Allergies  Current Meds  Medication Sig  . ALPRAZolam (XANAX) 1 MG tablet Take 1 mg by mouth 3 (three) times daily as needed for anxiety (([facility])).   . celecoxib (CELEBREX) 200 MG capsule Take 200 mg by mouth daily.  . diphenhydrAMINE (BENADRYL) 25 mg capsule Take 1 capsule (25 mg total) by mouth 3 (three) times daily.  . Doxylamine Succinate, Sleep, (SLEEP AID PO) Take 1 tablet by mouth at bedtime.  . rosuvastatin (CRESTOR) 20 MG tablet Take 20 mg by mouth daily.  . sertraline (ZOLOFT) 50 MG tablet Take 50 mg by mouth daily.  . Vitamin D, Ergocalciferol, (DRISDOL) 50000 units CAPS  capsule TAKE 1 CAPSULE BY MOUTH WEEKLY      Physical Exam Physical Exam BP 120/77   Pulse 91   Wt 167 lb (75.8 kg)   BMI 30.54 kg/m   Gen. appearance. The patient is well-developed and well-nourished, grooming and hygiene are normal. There are no gross congenital abnormalities  The patient is alert and oriented to person place and time  Mood and affect are normal  Ambulation No ambulatory disturbances  Examination reveals the following: On inspection we find pain and tenderness over the right shoulder  With the range of motion of  normal painful full range of motion  Stability tests were normal    Strength tests revealed grade 5 motor strength  Skin we find no rash ulceration or erythema  Sensation remains intact  Impression vascular system shows no peripheral edema  Data Reviewed X-ray show no fracture dislocation of glenohumeral arthritis  Assessment    Rotator cuff syndrome right shoulder Encounter Diagnoses  Name Primary?  . Acute pain of right shoulder   . Rotator cuff syndrome of right shoulder Yes       Plan     Procedure note the subacromial injection shoulder RIGHT  Verbal consent was obtained to inject the  RIGHT   Shoulder  Timeout was completed to confirm the injection site is a subacromial space of the  RIGHT  shoulder   Medication used Depo-Medrol 40 mg and lidocaine 1% 3 cc  Anesthesia was provided by ethyl chloride  The injection was performed in the RIGHT  posterior subacromial space. After pinning the skin with alcohol and anesthetized the skin with ethyl chloride the subacromial space was injected using a 20-gauge needle. There were no complications  Sterile dressing was applied.  Home exercise program  Continue Celebrex  Return as needed  Ultracet for severe pain       Arther Abbott 03/01/2016, 4:21 PM

## 2016-04-05 DIAGNOSIS — Z79899 Other long term (current) drug therapy: Secondary | ICD-10-CM | POA: Diagnosis not present

## 2016-04-05 DIAGNOSIS — B351 Tinea unguium: Secondary | ICD-10-CM | POA: Diagnosis not present

## 2016-04-05 DIAGNOSIS — I10 Essential (primary) hypertension: Secondary | ICD-10-CM | POA: Diagnosis not present

## 2016-04-05 DIAGNOSIS — F419 Anxiety disorder, unspecified: Secondary | ICD-10-CM | POA: Diagnosis not present

## 2016-04-22 ENCOUNTER — Ambulatory Visit (HOSPITAL_COMMUNITY)
Admission: RE | Admit: 2016-04-22 | Discharge: 2016-04-22 | Disposition: A | Discharge status: Home / Self Care | Payer: BLUE CROSS/BLUE SHIELD | Source: Ambulatory Visit | Attending: Obstetrics and Gynecology | Admitting: Obstetrics and Gynecology

## 2016-04-22 ENCOUNTER — Encounter (INDEPENDENT_AMBULATORY_CARE_PROVIDER_SITE_OTHER): Payer: Self-pay

## 2016-04-22 ENCOUNTER — Encounter: Payer: Self-pay | Admitting: Obstetrics & Gynecology

## 2016-04-22 ENCOUNTER — Ambulatory Visit (INDEPENDENT_AMBULATORY_CARE_PROVIDER_SITE_OTHER): Payer: BLUE CROSS/BLUE SHIELD | Admitting: Obstetrics & Gynecology

## 2016-04-22 VITALS — BP 136/74 | HR 84 | Wt 172.0 lb

## 2016-04-22 DIAGNOSIS — Z1231 Encounter for screening mammogram for malignant neoplasm of breast: Secondary | ICD-10-CM | POA: Diagnosis not present

## 2016-04-22 DIAGNOSIS — Z01419 Encounter for gynecological examination (general) (routine) without abnormal findings: Secondary | ICD-10-CM | POA: Diagnosis not present

## 2016-04-22 NOTE — Progress Notes (Signed)
Subjective:     Joyce [name] is a 60 y.o. female here for a routine exam.  No LMP recorded. Patient has had a hysterectomy. No obstetric history on file. Birth Control Method:  hysterectomy Menstrual Calendar(currently): amenorrhea  Current complaints: hemorrhoids.   Current acute medical issues:  none   Recent Gynecologic History No LMP recorded. Patient has had a hysterectomy. Last Pap: na,   Last mammogram: today,  pending  Past Medical History:  Diagnosis Date  . Cancer ([place])   . Headache(784.0)   . Hypercholesteremia     Past Surgical History:  Procedure Laterality Date  . ABDOMINAL HYSTERECTOMY    . COLONOSCOPY  04/05/2012   Procedure: COLONOSCOPY;  Surgeon: Rogene Houston, MD;  Location: [facility];  Service: Endoscopy;  Laterality: N/A;  930  . INCISION AND DRAINAGE Right 08/01/2015   Procedure: INCISION AND DRAINAGE RIGHT RING FINGER;  Surgeon: Carole Civil, MD;  Location: [facility] ORS;  Service: Orthopedics;  Laterality: Right;    OB History    No data available      Social History   Social History  . Marital status: Single    Spouse name: N/A  . Number of children: N/A  . Years of education: N/A   Social History Main Topics  . Smoking status: Former Smoker    Packs/day: 1.00    Years: 44.00  . Smokeless tobacco: Former Systems developer  . Alcohol use No  . Drug use: No  . Sexual activity: Yes   Other Topics Concern  . None   Social History Narrative  . None    Family History  Problem Relation Age of Onset  . Ovarian cancer Mother   . Bladder Cancer Father   . Lung cancer Sister   . Colon cancer Neg Hx      Current Outpatient Prescriptions:  .  ALPRAZolam (XANAX) 1 MG tablet, Take 1 mg by mouth 3 (three) times daily as needed for anxiety (([facility])). , Disp: , Rfl:  .  celecoxib (CELEBREX) 200 MG capsule, Take 200 mg by mouth daily., Disp: , Rfl:  .  diphenhydrAMINE (BENADRYL) 25 mg capsule, Take 1 capsule (25 mg total) by  mouth 3 (three) times daily., Disp: 15 capsule, Rfl: 0 .  Doxylamine Succinate, Sleep, (SLEEP AID PO), Take 1 tablet by mouth at bedtime., Disp: , Rfl:  .  rosuvastatin (CRESTOR) 20 MG tablet, Take 20 mg by mouth daily., Disp: , Rfl:  .  sertraline (ZOLOFT) 50 MG tablet, Take 50 mg by mouth daily., Disp: , Rfl: 2 .  Vitamin D, Ergocalciferol, (DRISDOL) 50000 units CAPS capsule, TAKE 1 CAPSULE BY MOUTH WEEKLY, Disp: , Rfl: 2  Review of Systems  Review of Systems  Constitutional: Negative for fever, chills, weight loss, malaise/fatigue and diaphoresis.  HENT: Negative for hearing loss, ear pain, nosebleeds, congestion, sore throat, neck pain, tinnitus and ear discharge.   Eyes: Negative for blurred vision, double vision, photophobia, pain, discharge and redness.  Respiratory: Negative for cough, hemoptysis, sputum production, shortness of breath, wheezing and stridor.   Cardiovascular: Negative for chest pain, palpitations, orthopnea, claudication, leg swelling and PND.  Gastrointestinal: negative for abdominal pain. Negative for heartburn, nausea, vomiting, diarrhea, constipation, blood in stool and melena.  Genitourinary: Negative for dysuria, urgency, frequency, hematuria and flank pain.  Musculoskeletal: Negative for myalgias, back pain, joint pain and falls.  Skin: Negative for itching and rash.  Neurological: Negative for dizziness, tingling, tremors, sensory change, speech change,  focal weakness, seizures, loss of consciousness, weakness and headaches.  Endo/Heme/Allergies: Negative for environmental allergies and polydipsia. Does not bruise/bleed easily.  Psychiatric/Behavioral: Negative for depression, suicidal ideas, hallucinations, memory loss and substance abuse. The patient is not nervous/anxious and does not have insomnia.        Objective:  Blood pressure 136/74, pulse 84, weight 172 lb (78 kg).   Physical Exam  Vitals reviewed. Constitutional: She is oriented to person,  place, and time. She appears well-developed and well-nourished.  HENT:  Head: Normocephalic and atraumatic.        Right Ear: External ear normal.  Left Ear: External ear normal.  Nose: Nose normal.  Mouth/Throat: Oropharynx is clear and moist.  Eyes: Conjunctivae and EOM are normal. Pupils are equal, round, and reactive to light. Right eye exhibits no discharge. Left eye exhibits no discharge. No scleral icterus.  Neck: Normal range of motion. Neck supple. No tracheal deviation present. No thyromegaly present.  Cardiovascular: Normal rate, regular rhythm, normal heart sounds and intact distal pulses.  Exam reveals no gallop and no friction rub.   No murmur heard. Respiratory: Effort normal and breath sounds normal. No respiratory distress. She has no wheezes. She has no rales. She exhibits no tenderness.  GI: Soft. Bowel sounds are normal. She exhibits no distension and no mass. There is no tenderness. There is no rebound and no guarding.  Genitourinary:  Breasts no masses skin changes or nipple changes bilaterally      Vulva is normal without lesions Vagina is pink moist without discharge Cervix absent Uterus is absent Adnexa is negative {Rectal  deferrecd due to hemorrhoids Musculoskeletal: Normal range of motion. She exhibits no edema and no tenderness.  Neurological: She is alert and oriented to person, place, and time. She has normal reflexes. She displays normal reflexes. No cranial nerve deficit. She exhibits normal muscle tone. Coordination normal.  Skin: Skin is warm and dry. No rash noted. No erythema. No pallor.  Psychiatric: She has a normal mood and affect. Her behavior is normal. Judgment and thought content normal.       Medications Ordered at today's visit: No orders of the defined types were placed in this encounter.   Other orders placed at today's visit: No orders of the defined types were placed in this encounter.     Assessment:    Healthy female exam.     Plan:    Mammogram ordered. Follow up in: 1 year.   2 years  Contact Dr Laural Golden regarding hemorrhoidal banding  No Follow-up on file.

## 2016-04-26 ENCOUNTER — Ambulatory Visit (HOSPITAL_COMMUNITY): Payer: BLUE CROSS/BLUE SHIELD

## 2016-04-26 ENCOUNTER — Other Ambulatory Visit: Payer: BLUE CROSS/BLUE SHIELD | Admitting: Obstetrics & Gynecology

## 2016-05-02 ENCOUNTER — Telehealth: Payer: Self-pay | Admitting: Obstetrics & Gynecology

## 2016-05-02 DIAGNOSIS — Z008 Encounter for other general examination: Secondary | ICD-10-CM | POA: Diagnosis not present

## 2016-05-02 DIAGNOSIS — Z1389 Encounter for screening for other disorder: Secondary | ICD-10-CM | POA: Diagnosis not present

## 2016-05-02 DIAGNOSIS — E785 Hyperlipidemia, unspecified: Secondary | ICD-10-CM | POA: Diagnosis not present

## 2016-05-02 DIAGNOSIS — I10 Essential (primary) hypertension: Secondary | ICD-10-CM | POA: Diagnosis not present

## 2016-05-02 NOTE — Telephone Encounter (Signed)
Left message x 1. JSY 

## 2016-05-02 NOTE — Telephone Encounter (Signed)
Spoke with pt letting her know Dr. Olevia Perches office don't do hemorrhoid banding. Pt needs to call Dr. Kizzie Fantasia office. I gave pt office number and address. Pt to call and schedule appt. [place]

## 2016-05-05 ENCOUNTER — Telehealth: Payer: Self-pay | Admitting: Obstetrics & Gynecology

## 2016-05-05 DIAGNOSIS — K649 Unspecified hemorrhoids: Secondary | ICD-10-CM

## 2016-05-05 NOTE — Telephone Encounter (Signed)
Spoke with pt. Pt states we have to send a referral to Dr. Oneida Alar office.

## 2016-05-06 NOTE — Telephone Encounter (Signed)
I called Dr. Nona Dell office and they said I could place referral through [facility]. Referral put in and I left message for pt that she should be hearing from their office. Pt was advised if she hasn't heard from them by Wednesday, call their office. [place]

## 2016-05-16 ENCOUNTER — Telehealth: Payer: Self-pay | Admitting: Adult Health

## 2016-05-16 NOTE — Telephone Encounter (Signed)
Spoke with pt. Pt states Dr. Oneida Alar didn't get referral from [place]. I will call and schedule appt. [place]

## 2016-05-17 ENCOUNTER — Other Ambulatory Visit: Payer: Self-pay | Admitting: *Deleted

## 2016-05-17 DIAGNOSIS — K649 Unspecified hemorrhoids: Secondary | ICD-10-CM

## 2016-05-17 NOTE — Telephone Encounter (Signed)
I called Dr. Oneida Alar office and was walked through on how to put referral in EPIC. They didn't receive referral before. Dr. Nona Dell office to contact pt with appt. I left message letting pt know. [place]

## 2016-07-06 DIAGNOSIS — Z79899 Other long term (current) drug therapy: Secondary | ICD-10-CM | POA: Diagnosis not present

## 2016-07-06 DIAGNOSIS — I10 Essential (primary) hypertension: Secondary | ICD-10-CM | POA: Diagnosis not present

## 2016-07-06 DIAGNOSIS — F419 Anxiety disorder, unspecified: Secondary | ICD-10-CM | POA: Diagnosis not present

## 2016-07-20 ENCOUNTER — Encounter: Payer: Self-pay | Admitting: Gastroenterology

## 2016-08-03 ENCOUNTER — Encounter: Payer: Self-pay | Admitting: Gastroenterology

## 2016-08-03 ENCOUNTER — Ambulatory Visit (INDEPENDENT_AMBULATORY_CARE_PROVIDER_SITE_OTHER): Payer: BLUE CROSS/BLUE SHIELD | Admitting: Gastroenterology

## 2016-08-03 ENCOUNTER — Other Ambulatory Visit: Payer: Self-pay

## 2016-08-03 DIAGNOSIS — K625 Hemorrhage of anus and rectum: Secondary | ICD-10-CM | POA: Diagnosis not present

## 2016-08-03 DIAGNOSIS — K6289 Other specified diseases of anus and rectum: Secondary | ICD-10-CM | POA: Diagnosis not present

## 2016-08-03 MED ORDER — HYDROCORTISONE 2.5 % RE CREA: 1.0000 "application " | TOPICAL_CREAM | Freq: Four times a day (QID) | RECTAL | 1 refills | Status: DC

## 2016-08-03 NOTE — Assessment & Plan Note (Signed)
MOST LIKELY DUE TO INTERNAL AND/OR EXTERNAL HEMORRHOIDS.  COMPLETE COLONOSCOPY  WITH POSSIBLE HEMORRHOID BANDING IN 2-3 WEEKS. DRINK WATER TO KEEP YOUR URINE LIGHT YELLOW. EAT FIBER USE ANUSOL FOUR TIMES A DAY FOR 12 DAYS TO RELIEVE RECTAL PAIN AND BLEEDING. FOLLOW UP IN 4 MOS.

## 2016-08-03 NOTE — Assessment & Plan Note (Addendum)
MOST LIKELY DUE TO INTERNAL HEMORRHOIDS. DIFFERENTIAL DIAGNOSIS  COLON POLYPS, AVMs, & LESS LIKELY COLON CA.  COMPLETE COLONOSCOPY  WITH POSSIBLE HEMORRHOID BANDING IN 2-3 WEEKS. DISCUSSED PROCEDURE, BENEFITS, & RISKS: < 1% chance of medication reaction, bleeding, perforation, or rupture of spleen/liver. NEEDS ZOFRAN 4 MG IV IN PREOP. DRINK WATER TO KEEP YOUR URINE LIGHT YELLOW. FOLLOW FULL LIQUID DIET ON DAY PRIOR TO COLONOSCOPY SEE INFO BELOW. USE ANUSOL FOUR TIMES A DAY FOR 12 DAYS TO RELIEVE RECTAL PAIN AND BLEEDING. FOLLOW UP IN 4 MOS.

## 2016-08-03 NOTE — Patient Instructions (Addendum)
COMPLETE COLONOSCOPY  WITH POSSIBLE HEMORRHOID BANDING IN 2-3 WEEKS.  DRINK WATER TO KEEP YOUR URINE LIGHT YELLOW.  FOLLOW FULL LIQUID DIET ON DAY PRIOR TO COLONOSCOPY. SEE INFO BELOW.  USE ANUSOL FOUR TIMES A DAY FOR 12 DAYS TO RELIEVE RECTAL PAIN AND BLEEDING.  FOLLOW UP IN 4 MOS.   Full Liquid Diet A high-calorie, high-protein supplement should be used to meet your nutritional requirements when the full liquid diet is continued for more than 2 or 3 days. If this diet is to be used for an extended period of time (more than 7 days), a multivitamin should be considered.  Breads and Starches  Allowed: None are allowed   Avoid: Any others.    Potatoes/Pasta/Rice  Allowed: ANY ITEM AS A SOUP OR SMALL PLATE OF MASHED POTATOES OR SCRAMBLED EGGS. (DO NOT EAT MORE THAN ONE SERVING ON THE DAY BEFORE COLONOSCOPY).      Vegetables  Allowed: Strained tomato or vegetable juice. Vegetables pureed in soup.   Avoid: Any others.    Fruit  Allowed: Any strained fruit juices and fruit drinks. Include 1 serving of citrus or vitamin C-enriched fruit juice daily.   Avoid: Any others.  Meat and Meat Substitutes  Allowed: Egg  Avoid: Any meat, fish, or fowl. All cheese.  Milk  Allowed: SOY Milk beverages, including milk shakes and instant breakfast mixes. Smooth yogurt.   Avoid: Any others. Avoid dairy products if not tolerated.    Soups and Combination Foods  Allowed: Broth, strained cream soups. Strained, broth-based soups.   Avoid: Any others.    Desserts and Sweets  Allowed: flavored gelatin, tapioca, ice cream, sherbet, smooth pudding, junket, fruit ices, frozen ice pops, pudding pops, frozen fudge pops, chocolate syrup. Sugar, honey, jelly, syrup.   Avoid: Any others.  Fats and Oils  Allowed: Margarine, butter, cream, sour cream, oils.   Avoid: Any others.  Beverages  Allowed: All.   Avoid: None.  Condiments  Allowed: Iodized salt, pepper, spices, flavorings.  Cocoa powder.   Avoid: Any others.    SAMPLE MEAL PLAN Breakfast   cup orange juice.   1 OR 2 EGGS  1 cup milk.   1 cup beverage (coffee or tea).   Cream or sugar, if desired.    Midmorning Snack  2 SCRAMBLED OR HARD BOILED EGG   Lunch  1 cup cream soup.    cup fruit juice.   1 cup milk.    cup custard.   1 cup beverage (coffee or tea).   Cream or sugar, if desired.    Midafternoon Snack  1 cup milk shake.  Dinner  1 cup cream soup.    cup fruit juice.   1 cup MILK    cup pudding.   1 cup beverage (coffee or tea).   Cream or sugar, if desired.  Evening Snack  1 cup supplement.  To increase calories, add sugar, cream, butter, or margarine if possible. Nutritional supplements will also increase the total calories.

## 2016-08-03 NOTE — Progress Notes (Signed)
ON RECALL  °
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
## 2016-08-03 NOTE — Progress Notes (Signed)
cc'ed to pcp °

## 2016-08-17 ENCOUNTER — Encounter (HOSPITAL_COMMUNITY): Payer: Self-pay | Admitting: *Deleted

## 2016-08-17 ENCOUNTER — Ambulatory Visit (HOSPITAL_COMMUNITY)
Admission: RE | Admit: 2016-08-17 | Discharge: 2016-08-17 | Disposition: A | Discharge status: Home / Self Care | Payer: BLUE CROSS/BLUE SHIELD | Source: Ambulatory Visit | Attending: Gastroenterology | Admitting: Gastroenterology

## 2016-08-17 ENCOUNTER — Encounter (HOSPITAL_COMMUNITY)
Admission: RE | Disposition: A | Discharge status: Home / Self Care | Payer: Self-pay | Source: Ambulatory Visit | Attending: Gastroenterology

## 2016-08-17 DIAGNOSIS — Q438 Other specified congenital malformations of intestine: Secondary | ICD-10-CM | POA: Diagnosis not present

## 2016-08-17 DIAGNOSIS — Z8052 Family history of malignant neoplasm of bladder: Secondary | ICD-10-CM | POA: Diagnosis not present

## 2016-08-17 DIAGNOSIS — K648 Other hemorrhoids: Secondary | ICD-10-CM | POA: Insufficient documentation

## 2016-08-17 DIAGNOSIS — Z90710 Acquired absence of both cervix and uterus: Secondary | ICD-10-CM | POA: Insufficient documentation

## 2016-08-17 DIAGNOSIS — Z800 Family history of malignant neoplasm of digestive organs: Secondary | ICD-10-CM | POA: Insufficient documentation

## 2016-08-17 DIAGNOSIS — Z87891 Personal history of nicotine dependence: Secondary | ICD-10-CM | POA: Diagnosis not present

## 2016-08-17 DIAGNOSIS — Z79899 Other long term (current) drug therapy: Secondary | ICD-10-CM | POA: Insufficient documentation

## 2016-08-17 DIAGNOSIS — E7800 Pure hypercholesterolemia, unspecified: Secondary | ICD-10-CM | POA: Insufficient documentation

## 2016-08-17 DIAGNOSIS — Z8041 Family history of malignant neoplasm of ovary: Secondary | ICD-10-CM | POA: Diagnosis not present

## 2016-08-17 DIAGNOSIS — D122 Benign neoplasm of ascending colon: Secondary | ICD-10-CM | POA: Diagnosis not present

## 2016-08-17 DIAGNOSIS — K635 Polyp of colon: Secondary | ICD-10-CM | POA: Diagnosis not present

## 2016-08-17 DIAGNOSIS — K625 Hemorrhage of anus and rectum: Secondary | ICD-10-CM | POA: Diagnosis not present

## 2016-08-17 DIAGNOSIS — Z859 Personal history of malignant neoplasm, unspecified: Secondary | ICD-10-CM | POA: Diagnosis not present

## 2016-08-17 DIAGNOSIS — K649 Unspecified hemorrhoids: Secondary | ICD-10-CM

## 2016-08-17 DIAGNOSIS — Z801 Family history of malignant neoplasm of trachea, bronchus and lung: Secondary | ICD-10-CM | POA: Diagnosis not present

## 2016-08-17 DIAGNOSIS — R51 Headache: Secondary | ICD-10-CM | POA: Insufficient documentation

## 2016-08-17 DIAGNOSIS — D123 Benign neoplasm of transverse colon: Secondary | ICD-10-CM | POA: Diagnosis not present

## 2016-08-17 HISTORY — PX: COLONOSCOPY: SHX5424

## 2016-08-17 HISTORY — PX: HEMORRHOID BANDING: SHX5850

## 2016-08-17 SURGERY — COLONOSCOPY
Anesthesia: Moderate Sedation

## 2016-08-17 MED ORDER — MIDAZOLAM HCL 5 MG/5ML IJ SOLN
INTRAMUSCULAR | Status: DC | PRN
  Administered 2016-08-17: 1 mg via INTRAVENOUS
  Administered 2016-08-17 (×2): 2 mg via INTRAVENOUS

## 2016-08-17 MED ORDER — ONDANSETRON HCL 4 MG/2ML IJ SOLN
4.0000 mg | Freq: Once | INTRAMUSCULAR | Status: AC
  Administered 2016-08-17: 4 mg via INTRAVENOUS

## 2016-08-17 MED ORDER — MEPERIDINE HCL 100 MG/ML IJ SOLN
INTRAMUSCULAR | Status: DC | PRN
  Administered 2016-08-17 (×2): 25 mg via INTRAVENOUS
  Administered 2016-08-17: 50 mg via INTRAVENOUS

## 2016-08-17 MED ORDER — ONDANSETRON HCL 4 MG/2ML IJ SOLN
INTRAMUSCULAR | Status: AC
  Filled 2016-08-17: qty 2

## 2016-08-17 MED ORDER — SODIUM CHLORIDE 0.9 % IV SOLN
INTRAVENOUS | Status: DC
  Administered 2016-08-17: 09:00:00 via INTRAVENOUS

## 2016-08-17 MED ORDER — MIDAZOLAM HCL 5 MG/5ML IJ SOLN
INTRAMUSCULAR | Status: AC
  Filled 2016-08-17: qty 10

## 2016-08-17 MED ORDER — MEPERIDINE HCL 100 MG/ML IJ SOLN
INTRAMUSCULAR | Status: AC
  Filled 2016-08-17: qty 2

## 2016-08-17 NOTE — Interval H&P Note (Signed)
History and Physical Interval Note:  08/17/2016 10:02 AM  Joyce [name]  has presented today for surgery, with the diagnosis of rectal bleeding  The various methods of treatment have been discussed with the patient and family. After consideration of risks, benefits and other options for treatment, the patient has consented to  Procedure(s) with comments: COLONOSCOPY (N/A) - 10:00am HEMORRHOID BANDING (N/A) as a surgical intervention .  The patient's history has been reviewed, patient examined, no change in status, stable for surgery.  I have reviewed the patient's chart and labs.  Questions were answered to the patient's satisfaction.     [employer]

## 2016-08-17 NOTE — Discharge Instructions (Signed)
You have internal hemorrhoids. YOU had one small POLYP removed. I PLACED 3 BANDS TO TREAT YOUR HEMORRHOIDAL BLEEDING. YOU MAY SOME MILD BLEEDING OVER THE NEXT 3 TO 5 DAYS.   CALL [phone] IF YOU HAVE A FEVER, A LARGE AMOUNT OF BLEEDING, OR DIFFICULTY URINATING.  YOU MAY USE NAPROXEN OR IBUPROFEN TWICE DAILY FOR RECTAL DISCOMFORT. TYLENOL IF NEEDED FOR ADDITIONAL PAIN RELIEF.  USE PREPARATION H OR ANUSOL HC FOUR TIMES  A DAY IF NEEDED TO RELIEVE RECTAL PAIN/PRESSURE/BLEEDING.  YOU CAN USE COLACE TWICE DAILY TO SOFTEN STOOL.   CONTINUE YOUR WEIGHT LOSS EFFORTS. LOSE TEN POUNDS.  DRINK WATER TO KEEP YOUR URINE LIGHT YELLOW.  FOLLOW A HIGH FIBER DIET. AVOID ITEMS THAT CAUSE BLOATING & GAS.   YOUR BIOPSY RESULTS WILL BE AVAILABLE IN MY CHART AFTER APR 27 AND MY OFFICE WILL CONTACT YOU IN 10-14 DAYS WITH YOUR RESULTS.   FOLLOW UP IN 4 MOS.   Next colonoscopy in 5-10 years.     Colonoscopy Care After Read the instructions outlined below and refer to this sheet in the next week. These discharge instructions provide you with general information on caring for yourself after you leave the hospital. While your treatment has been planned according to the most current medical practices available, unavoidable complications occasionally occur. If you have any problems or questions after discharge, call DR. Cristina Ceniceros, [phone].  ACTIVITY  You may resume your regular activity, but move at a slower pace for the next 24 hours.   Take frequent rest periods for the next 24 hours.   Walking will help get rid of the air and reduce the bloated feeling in your belly (abdomen).   No driving for 24 hours (because of the medicine (anesthesia) used during the test).   You may shower.   Do not sign any important legal documents or operate any machinery for 24 hours (because of the anesthesia used during the test).    NUTRITION  Drink plenty of fluids.   You may resume your normal diet as  instructed by your doctor.   Begin with a light meal and progress to your normal diet. Heavy or fried foods are harder to digest and may make you feel sick to your stomach (nauseated).   Avoid alcoholic beverages for 24 hours or as instructed.    MEDICATIONS  You may resume your normal medications.   WHAT YOU CAN EXPECT TODAY  Some feelings of bloating in the abdomen.   Passage of more gas than usual.   Spotting of blood in your stool or on the toilet paper  .  IF YOU HAD POLYPS REMOVED DURING THE COLONOSCOPY:  Eat a soft diet IF YOU HAVE NAUSEA, BLOATING, ABDOMINAL PAIN, OR VOMITING.    FINDING OUT THE RESULTS OF YOUR TEST Not all test results are available during your visit. DR. Oneida Alar WILL CALL YOU WITHIN 14 DAYS OF YOUR PROCEDUE WITH YOUR RESULTS. Do not assume everything is normal if you have not heard from DR. Tajanay Hurley IN ONE WEEK, CALL HER OFFICE AT [phone].  SEEK IMMEDIATE MEDICAL ATTENTION AND CALL THE OFFICE: [phone] IF:  You have more than a spotting of blood in your stool.   Your belly is swollen (abdominal distention).   You are nauseated or vomiting.   You have a temperature over 101F.   You have abdominal pain or discomfort that is severe or gets worse throughout the day.   HEMORRHOIDAL BANDING COMPLICATIONS:  COMMON: 1. MINOR PAIN  UNCOMMON: 1. ABSCESS  2. BAND FALLS OFF 3. PROLAPSE OF HEMORRHOIDS AND PAIN 4. ULCER BLEEDING  A. USUALLY SELF-LIMITED: MAY LAST 3-5 DAYS  B. MAY REQUIRE INTERVENTION: 1-2 WEEKS AFTER INTERACTIONS 5. NECROTIZING PELVIC SEPSIS  A. SYMPTOMS: FEVER, PAIN, DIFFICULTY URINATING   Hemorrhoids Hemorrhoids are dilated (enlarged) veins around the rectum. Sometimes clots will form in the veins. This makes them swollen and painful. These are called thrombosed hemorrhoids. Causes of hemorrhoids include:  Constipation.   Straining to have a bowel movement.   HEAVY LIFTING  HOME CARE INSTRUCTIONS  Eat a well  balanced diet and drink 6 to 8 glasses of water every day to avoid constipation. You may also use a bulk laxative.   Avoid straining to have bowel movements.   Keep anal area dry and clean.   Do not use a donut shaped pillow or sit on the toilet for long periods. This increases blood pooling and pain.   Move your bowels when your body has the urge; this will require less straining and will decrease pain and pressure.  High-Fiber Diet A high-fiber diet changes your normal diet to include more whole grains, legumes, fruits, and vegetables. Changes in the diet involve replacing refined carbohydrates with unrefined foods. The calorie level of the diet is essentially unchanged. The Dietary Reference Intake (recommended amount) for adult males is 38 grams per day. For adult females, it is 25 grams per day. Pregnant and lactating women should consume 28 grams of fiber per day. Fiber is the intact part of a plant that is not broken down during digestion. Functional fiber is fiber that has been isolated from the plant to provide a beneficial effect in the body.  PURPOSE  Increase stool bulk.   Ease and regulate bowel movements.   Lower cholesterol.   REDUCE RISK OF COLON CANCER  INDICATIONS THAT YOU NEED MORE FIBER  Constipation and hemorrhoids.   Uncomplicated diverticulosis (intestine condition) and irritable bowel syndrome.   Weight management.   As a protective measure against hardening of the arteries (atherosclerosis), diabetes, and cancer.   GUIDELINES FOR INCREASING FIBER IN THE DIET  Start adding fiber to the diet slowly. A gradual increase of about 5 more grams (2 slices of whole-wheat bread, 2 servings of most fruits or vegetables, or 1 bowl of high-fiber cereal) per day is best. Too rapid an increase in fiber may result in constipation, flatulence, and bloating.   Drink enough water and fluids to keep your urine clear or pale yellow. Water, juice, or caffeine-free drinks are  recommended. Not drinking enough fluid may cause constipation.   Eat a variety of high-fiber foods rather than one type of fiber.   Try to increase your intake of fiber through using high-fiber foods rather than fiber pills or supplements that contain small amounts of fiber.   The goal is to change the types of food eaten. Do not supplement your present diet with high-fiber foods, but replace foods in your present diet.  INCLUDE A VARIETY OF FIBER SOURCES  Replace refined and processed grains with whole grains, canned fruits with fresh fruits, and incorporate other fiber sources. White rice, white breads, and most bakery goods contain little or no fiber.   Brown whole-grain rice, buckwheat oats, and many fruits and vegetables are all good sources of fiber. These include: broccoli, Brussels sprouts, cabbage, cauliflower, beets, sweet potatoes, white potatoes (skin on), carrots, tomatoes, eggplant, squash, berries, fresh fruits, and dried fruits.   Cereals appear to be the richest source of  fiber. Cereal fiber is found in whole grains and bran. Bran is the fiber-rich outer coat of cereal grain, which is largely removed in refining. In whole-grain cereals, the bran remains. In breakfast cereals, the largest amount of fiber is found in those with "bran" in their names. The fiber content is sometimes indicated on the label.   You may need to include additional fruits and vegetables each day.   In baking, for 1 cup white flour, you may use the following substitutions:   1 cup whole-wheat flour minus 2 tablespoons.   1/2 cup white flour plus 1/2 cup whole-wheat flour.

## 2016-08-17 NOTE — Procedures (Signed)
REFERRING PROVIDER: [name], MD  PROCEDURE: COLONOSCOPY WITH COLD FORCEPS POLYPECTOMY AND HEMORRHOID BANDING  INDICATION: RECTAL BLEEDING  Sedation time: 43 mins Cecal withdrawal time: 16 mins  FINDINGS: 1. NORMAL ILEUM 2. ONE 3 MM SESSILE HEPATIC FLEXURE POLYP REMOVED VIA COLD FORCEPS 3. MODERATE INTERNAL HEMORRHOIDS. BANDS PLACED x 3. 4. REDUNDANT LEFT COLON  RECOMMENDATIONS: CONTINUE YOUR WEIGHT LOSS EFFORTS. LOSE TEN POUNDS. DRINK WATER TO KEEP YOUR URINE LIGHT YELLOW. FOLLOW A HIGH FIBER DIET. AVOID ITEMS THAT CAUSE BLOATING & GAS.  USE PREPARATION H OR ANUSOL HC FOUR TIMES  A DAY IF NEEDED TO RELIEVE RECTAL PAIN/PRESSURE/BLEEDING. BIOPSY RESULTS WILL BE AVAILABLE IN MY CHART AFTER APR 27 AND MY OFFICE WILL CONTACT YOU IN 10-14 DAYS WITH YOUR RESULTS.  OPV IN AUG 2018 Next colonoscopy in 5-10 years.    PROCEDURE TECHNIQUE: PHYSICAL EXAM WAS PERFORMED AND CONSENT OBTAINED. SCOPE ADVANCED TO THE ILEUM WITH THE ASSISTANCE OF COLOWRAP.  COLD FORCEPS POLYPECTOMY PERFORMED. THE SCOPE WAS REMOVED BY CAREFULLY EXAMINING THE COLOR, TEXTURE, AND ANATOMY OF THE MUCOSA.  COLONOSCOPE EXCHANGED FOR GASTROSCOPE. BANDING DEVICE APPLIED. 3 BANDS PLACED. PT WAS RECOVERED AND DISCHARGED HOME IN SATISFACTORY CONDITION AND WITHOUT RECTAL PAIN.
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
## 2016-08-22 ENCOUNTER — Encounter (HOSPITAL_COMMUNITY): Payer: Self-pay | Admitting: Gastroenterology

## 2016-09-03 ENCOUNTER — Telehealth: Payer: Self-pay | Admitting: Gastroenterology

## 2016-09-03 NOTE — Telephone Encounter (Signed)
Please call pt. She had ONE HYPERPLASTIC POLYP removed.   CONTINUE YOUR WEIGHT LOSS EFFORTS. LOSE TEN POUNDS. DRINK WATER TO KEEP YOUR URINE LIGHT YELLOW. FOLLOW A HIGH FIBER DIET. AVOID ITEMS THAT CAUSE BLOATING & GAS.  USE PREPARATION H OR ANUSOL HC FOUR TIMES  A DAY IF NEEDED TO RELIEVE RECTAL PAIN/PRESSURE/BLEEDING.  OPV IN AUG 2018 E30 RECTAL BLEEDING/PAIN Next colonoscopy in 10 years NOT 5.Marland Kitchen

## 2016-09-05 DIAGNOSIS — L409 Psoriasis, unspecified: Secondary | ICD-10-CM | POA: Diagnosis not present

## 2016-09-05 DIAGNOSIS — E785 Hyperlipidemia, unspecified: Secondary | ICD-10-CM | POA: Diagnosis not present

## 2016-09-05 DIAGNOSIS — F419 Anxiety disorder, unspecified: Secondary | ICD-10-CM | POA: Diagnosis not present

## 2016-09-05 DIAGNOSIS — E559 Vitamin D deficiency, unspecified: Secondary | ICD-10-CM | POA: Diagnosis not present

## 2016-09-05 NOTE — Telephone Encounter (Signed)
Pt is aware of results. 

## 2016-09-05 NOTE — Telephone Encounter (Signed)
ON RECALL FOR TCS AND OFFICE VISIT  °

## 2016-09-05 NOTE — Telephone Encounter (Signed)
LMOM to call.

## 2016-10-04 ENCOUNTER — Ambulatory Visit (INDEPENDENT_AMBULATORY_CARE_PROVIDER_SITE_OTHER): Payer: BLUE CROSS/BLUE SHIELD | Admitting: Family Medicine

## 2016-10-04 ENCOUNTER — Encounter: Payer: Self-pay | Admitting: Family Medicine

## 2016-10-04 VITALS — BP 139/87 | HR 89 | Temp 97.1°F | Ht 62.0 in | Wt 167.4 lb

## 2016-10-04 DIAGNOSIS — I10 Essential (primary) hypertension: Secondary | ICD-10-CM | POA: Insufficient documentation

## 2016-10-04 DIAGNOSIS — F419 Anxiety disorder, unspecified: Secondary | ICD-10-CM | POA: Diagnosis not present

## 2016-10-04 DIAGNOSIS — L409 Psoriasis, unspecified: Secondary | ICD-10-CM | POA: Diagnosis not present

## 2016-10-04 MED ORDER — ALPRAZOLAM 1 MG PO TABS: 1.0000 mg | ORAL_TABLET | Freq: Three times a day (TID) | ORAL | 0 refills | Status: DC

## 2016-10-04 MED ORDER — ESCITALOPRAM OXALATE 20 MG PO TABS: 20.0000 mg | ORAL_TABLET | Freq: Every day | ORAL | 5 refills | Status: DC

## 2016-10-04 NOTE — Patient Instructions (Signed)
Great to meet you!  Next visit we will check up to see how you are doing on lexaporo and  review a controlled substance contract.   We would consider trying clonazepam instead of xanax   We would also consider a psychiatrist if we are not able to get good control of your anxiety.

## 2016-10-04 NOTE — Progress Notes (Signed)
   HPI  Patient presents today here to establish care with anxiety, hypertension, and psoriasis.  Patient has psoriasis. It is exacerbated by stress, recently she's had reductions in her Xanax dose states that the rash is getting worse.  She has been on 1 mg of Xanax 4 times daily for years. Her previous provider retired, those who are placed him recommended that she reduce the dose and they reduced it. She requests going back up to 4 mg of Xanax daily.  She has good medication compliance of hypertension medicines. She denies history of stroke or heart attack. She has a history of cervical cancer and is now status post TAH.  PMH: Cervical cancer, anxiety, arthritis, hyperlipidemia, headaches, hypertension, psoriasis Surgical history: Abdominal hysterectomy, colonoscopy 2, Past family history: Bladder cancer in father, liver cancer in brother, lung cancer and sister, ovarian cancer in mother Past social history: Former smoker, no alcohol or drug use. ROS: Per HPI  Objective: BP 139/87   Pulse 89   Temp 97.1 F (36.2 C) (Oral)   Ht 5\' 2"  (1.575 m)   Wt 167 lb 6.4 oz (75.9 kg)   BMI 30.62 kg/m  Gen: NAD, alert, cooperative with exam HEENT: NCAT CV: RRR, good S1/S2, no murmur Resp: CTABL, no wheezes, non-labored Abd: SNTND, BS present, no guarding or organomegaly Ext: No edema, warm Neuro: Alert and oriented, No gross deficits Skin: Scaly lesions on bilateral home  Assessment and plan:  # Anxiety Significant anxiety, patient is on high-dose of benzodiazepine, this was clearly discussed with her. I initially recommended psychiatry referral, she does not necessarily want to pursue psychiatric care. I presented an alternative ideas which is adding an SSRI and considering changing benzodiazepine to a longer acting medication like Ativan or clonazepam, she is open to this idea. Start Lexapro, no SI. Although 3-4 weeks Refilled Xanax No. 90, refill 0 Review controlled substance  contract next visit.  # Psoriasis Exacerbated Patient feels this is due to anxiety Continue follow-up with dermatology  # Hypertension Well-controlled on current medications including lisinopril. Labs to be reviewed on her records.   Meds ordered this encounter  Medications  . ALPRAZolam (XANAX) 1 MG tablet    Sig: Take 1 tablet (1 mg total) by mouth 3 (three) times daily.    Dispense:  90 tablet    Refill:  0  . escitalopram (LEXAPRO) 20 MG tablet    Sig: Take 1 tablet (20 mg total) by mouth daily.    Dispense:  30 tablet    Refill:  [place], MD [facility] 10/04/2016, 1:48 PM

## 2016-10-19 ENCOUNTER — Encounter: Payer: Self-pay | Admitting: Gastroenterology

## 2016-11-01 ENCOUNTER — Ambulatory Visit (INDEPENDENT_AMBULATORY_CARE_PROVIDER_SITE_OTHER): Payer: BLUE CROSS/BLUE SHIELD | Admitting: Family Medicine

## 2016-11-01 ENCOUNTER — Encounter: Payer: Self-pay | Admitting: Family Medicine

## 2016-11-01 VITALS — BP 135/86 | HR 69 | Temp 97.3°F | Ht 62.0 in | Wt 165.6 lb

## 2016-11-01 DIAGNOSIS — F419 Anxiety disorder, unspecified: Secondary | ICD-10-CM | POA: Diagnosis not present

## 2016-11-01 MED ORDER — ESCITALOPRAM OXALATE 20 MG PO TABS: 20.0000 mg | ORAL_TABLET | Freq: Every day | ORAL | 3 refills | Status: DC

## 2016-11-01 MED ORDER — ALPRAZOLAM 1 MG PO TABS: 1.0000 mg | ORAL_TABLET | Freq: Three times a day (TID) | ORAL | 2 refills | Status: DC

## 2016-11-01 NOTE — Progress Notes (Signed)
an

## 2016-11-01 NOTE — Progress Notes (Signed)
   HPI  Patient presents today here for follow-up of anxiety.  Patient states that she's tolerating Lexapro well but she feels that it's making a positive benefit for her. She was previously not doing well with 1 mg and extra times daily and requesting to go to 4 times daily. She states that with the help of Lexapro Xanax is doing perfectly fine.  She denies suicidal thoughts. She denies any upset stomach or other concerning side effects.  She has had a recent colonoscopy. She has labs done at work and states that she had her last set about 6 months ago, she has a follow-up coming up in about one month and will bring in copies after that.  PMH: Smoking status noted ROS: Per HPI  Objective: BP 135/86   Pulse 69   Temp (!) 97.3 F (36.3 C) (Oral)   Ht 5\' 2"  (1.575 m)   Wt 165 lb 9.6 oz (75.1 kg)   BMI 30.29 kg/m  Gen: NAD, alert, cooperative with exam HEENT: NCAT CV: RRR, good S1/S2, no murmur Resp: CTABL, no wheezes, non-labored Ext: No edema, warm Neuro: Alert and oriented, No gross deficits  Depression screen [facility] 2/9 11/01/2016 10/04/2016 04/22/2016  Decreased Interest 0 0 0  Down, Depressed, Hopeless 0 0 0  PHQ - 2 Score 0 0 0      Assessment and plan:  # Anxiety Doing well with Lexapro plus Xanax Refill Xanax 3 months. [place] controlled substance database reviewed with no red flags. I'm happy that she is getting good benefit with Lexapro, hopefully in the future we will be able to reduce her benzodiazepine dose.     Meds ordered this encounter  Medications  . ALPRAZolam (XANAX) 1 MG tablet    Sig: Take 1 tablet (1 mg total) by mouth 3 (three) times daily.    Dispense:  90 tablet    Refill:  2  . escitalopram (LEXAPRO) 20 MG tablet    Sig: Take 1 tablet (20 mg total) by mouth daily.    Dispense:  90 tablet    Refill:  [place], MD [facility] 11/01/2016, 4:29 PM

## 2016-11-01 NOTE — Patient Instructions (Addendum)
Great to see you!  Lets see you again in 3 months unless you need [place] sooner.   Please let me know right away if you have any concerns for side effects  Please bring in a copy of you rlabs after your visit in August

## 2016-11-21 DIAGNOSIS — E785 Hyperlipidemia, unspecified: Secondary | ICD-10-CM | POA: Diagnosis not present

## 2016-11-21 DIAGNOSIS — Z79899 Other long term (current) drug therapy: Secondary | ICD-10-CM | POA: Diagnosis not present

## 2016-11-21 DIAGNOSIS — E559 Vitamin D deficiency, unspecified: Secondary | ICD-10-CM | POA: Diagnosis not present

## 2016-11-21 DIAGNOSIS — Z139 Encounter for screening, unspecified: Secondary | ICD-10-CM | POA: Diagnosis not present

## 2016-12-05 DIAGNOSIS — E785 Hyperlipidemia, unspecified: Secondary | ICD-10-CM | POA: Diagnosis not present

## 2016-12-05 DIAGNOSIS — Z719 Counseling, unspecified: Secondary | ICD-10-CM | POA: Diagnosis not present

## 2016-12-05 DIAGNOSIS — Z008 Encounter for other general examination: Secondary | ICD-10-CM | POA: Diagnosis not present

## 2016-12-05 DIAGNOSIS — E559 Vitamin D deficiency, unspecified: Secondary | ICD-10-CM | POA: Diagnosis not present

## 2016-12-05 DIAGNOSIS — I10 Essential (primary) hypertension: Secondary | ICD-10-CM | POA: Diagnosis not present

## 2016-12-08 ENCOUNTER — Other Ambulatory Visit: Payer: Self-pay

## 2016-12-08 ENCOUNTER — Telehealth: Payer: Self-pay | Admitting: Family Medicine

## 2016-12-08 MED ORDER — EZETIMIBE 10 MG PO TABS: 10.0000 mg | ORAL_TABLET | Freq: Every day | ORAL | 3 refills | Status: DC

## 2016-12-08 MED ORDER — ROSUVASTATIN CALCIUM 10 MG PO TABS: 10.0000 mg | ORAL_TABLET | Freq: Every day | ORAL | 1 refills | Status: DC

## 2016-12-08 NOTE — Progress Notes (Signed)
Called and clarified  I had considered she was noncompliant based on her numbers. On a new start I would give 10 mg so that was recommended. She will maintain 20 mg crestor and start zetia on top.   No Hx of ASCVD so I will keep LDL goal of 100 or less.   Pt expressed good understanding.   Laroy Apple, MD [facility] 12/08/2016, 5:34 PM

## 2016-12-08 NOTE — Progress Notes (Signed)
Crestor 10mg  sent to [facility] per Dr. Wendi Snipes. Patient states she was taking the Crestor 20mg  daily as it was in her chart. Changed to 10mg  per your request. Patient aware to follow up in 2 months. - FYI

## 2017-02-02 ENCOUNTER — Ambulatory Visit (INDEPENDENT_AMBULATORY_CARE_PROVIDER_SITE_OTHER): Payer: BLUE CROSS/BLUE SHIELD | Admitting: Family Medicine

## 2017-02-02 ENCOUNTER — Encounter: Payer: Self-pay | Admitting: Family Medicine

## 2017-02-02 VITALS — BP 133/81 | HR 83 | Temp 97.2°F | Ht 62.0 in | Wt 167.4 lb

## 2017-02-02 DIAGNOSIS — I10 Essential (primary) hypertension: Secondary | ICD-10-CM

## 2017-02-02 DIAGNOSIS — F419 Anxiety disorder, unspecified: Secondary | ICD-10-CM

## 2017-02-02 NOTE — Progress Notes (Signed)
   HPI  Patient presents today for anxiety and hypertension.  Anxiety Doing well with Xanax plus Lexapro, Lexapro has made a big difference for her. Needs refill of Xanax.  Hypertension Good medication compliance with lisinopril. No headache or chest pain.  PMH: Smoking status noted ROS: Per HPI  Objective: BP 133/81   Pulse 83   Temp (!) 97.2 F (36.2 C) (Oral)   Ht 5\' 2"  (1.575 m)   Wt 167 lb 6.4 oz (75.9 kg)   BMI 30.62 kg/m  Gen: NAD, alert, cooperative with exam HEENT: NCAT CV: RRR, good S1/S2, no murmur Resp: CTABL, no wheezes, non-labored Ext: No edema, warm Neuro: Alert and oriented, No gross deficits  Assessment and plan:  # Anxiety Stable No changes Refilled Xanax No. 90, 1 mg, 3 refills, handwritten prescription given that we had just lost power in our clinic.  # Hypertension Doing well No changes     Meds ordered this encounter  Medications  . ALPRAZolam (XANAX) 1 MG tablet    Sig: Take 1 tablet (1 mg total) by mouth 3 (three) times daily.    Dispense:  90 tablet    Refill:  [place], MD [facility] 02/03/2017, 8:43 AM

## 2017-02-03 MED ORDER — ALPRAZOLAM 1 MG PO TABS: 1.0000 mg | ORAL_TABLET | Freq: Three times a day (TID) | ORAL | 3 refills | Status: DC

## 2017-03-15 DIAGNOSIS — Z79899 Other long term (current) drug therapy: Secondary | ICD-10-CM | POA: Diagnosis not present

## 2017-03-15 DIAGNOSIS — E559 Vitamin D deficiency, unspecified: Secondary | ICD-10-CM | POA: Diagnosis not present

## 2017-03-15 DIAGNOSIS — Z139 Encounter for screening, unspecified: Secondary | ICD-10-CM | POA: Diagnosis not present

## 2017-03-15 DIAGNOSIS — E785 Hyperlipidemia, unspecified: Secondary | ICD-10-CM | POA: Diagnosis not present

## 2017-03-20 DIAGNOSIS — I10 Essential (primary) hypertension: Secondary | ICD-10-CM | POA: Diagnosis not present

## 2017-03-20 DIAGNOSIS — E785 Hyperlipidemia, unspecified: Secondary | ICD-10-CM | POA: Diagnosis not present

## 2017-03-20 DIAGNOSIS — E559 Vitamin D deficiency, unspecified: Secondary | ICD-10-CM | POA: Diagnosis not present

## 2017-03-28 ENCOUNTER — Other Ambulatory Visit: Payer: Self-pay | Admitting: Obstetrics & Gynecology

## 2017-03-28 DIAGNOSIS — Z1231 Encounter for screening mammogram for malignant neoplasm of breast: Secondary | ICD-10-CM

## 2017-04-19 ENCOUNTER — Ambulatory Visit (HOSPITAL_COMMUNITY)
Admission: RE | Admit: 2017-04-19 | Discharge: 2017-04-19 | Disposition: A | Discharge status: Home / Self Care | Payer: BLUE CROSS/BLUE SHIELD | Source: Ambulatory Visit | Attending: Obstetrics & Gynecology | Admitting: Obstetrics & Gynecology

## 2017-04-19 ENCOUNTER — Other Ambulatory Visit: Payer: BLUE CROSS/BLUE SHIELD | Admitting: Obstetrics & Gynecology

## 2017-04-19 DIAGNOSIS — Z1231 Encounter for screening mammogram for malignant neoplasm of breast: Secondary | ICD-10-CM

## 2017-04-24 ENCOUNTER — Ambulatory Visit (HOSPITAL_COMMUNITY)
Admission: RE | Admit: 2017-04-24 | Discharge: 2017-04-24 | Disposition: A | Discharge status: Home / Self Care | Payer: BLUE CROSS/BLUE SHIELD | Source: Ambulatory Visit | Attending: Obstetrics & Gynecology | Admitting: Obstetrics & Gynecology

## 2017-04-24 DIAGNOSIS — Z1231 Encounter for screening mammogram for malignant neoplasm of breast: Secondary | ICD-10-CM | POA: Diagnosis not present

## 2017-05-22 ENCOUNTER — Other Ambulatory Visit (HOSPITAL_COMMUNITY)
Admission: RE | Admit: 2017-05-22 | Discharge: 2017-05-22 | Disposition: A | Discharge status: Home / Self Care | Payer: BLUE CROSS/BLUE SHIELD | Source: Ambulatory Visit | Attending: Obstetrics & Gynecology | Admitting: Obstetrics & Gynecology

## 2017-05-22 ENCOUNTER — Ambulatory Visit (INDEPENDENT_AMBULATORY_CARE_PROVIDER_SITE_OTHER): Payer: BLUE CROSS/BLUE SHIELD | Admitting: Obstetrics & Gynecology

## 2017-05-22 ENCOUNTER — Encounter: Payer: Self-pay | Admitting: Obstetrics & Gynecology

## 2017-05-22 VITALS — BP 170/100 | HR 66 | Ht 62.0 in | Wt 170.5 lb

## 2017-05-22 DIAGNOSIS — Z1211 Encounter for screening for malignant neoplasm of colon: Secondary | ICD-10-CM

## 2017-05-22 DIAGNOSIS — Z1212 Encounter for screening for malignant neoplasm of rectum: Secondary | ICD-10-CM | POA: Diagnosis not present

## 2017-05-22 DIAGNOSIS — Z01419 Encounter for gynecological examination (general) (routine) without abnormal findings: Secondary | ICD-10-CM | POA: Diagnosis not present

## 2017-05-22 DIAGNOSIS — Z8541 Personal history of malignant neoplasm of cervix uteri: Secondary | ICD-10-CM

## 2017-05-22 LAB — HEMOCCULT GUIAC POC 1CARD (OFFICE): Fecal Occult Blood, POC: NEGATIVE

## 2017-05-22 NOTE — Addendum Note (Signed)
Addended by: Linton Rump on: 05/22/2017 09:57 AM   Modules accepted: Orders

## 2017-05-22 NOTE — Progress Notes (Signed)
Subjective:     Joyce [name] is a 62 y.o. female here for a routine exam.  No LMP recorded. Patient has had a hysterectomy. G1P1001 Birth Control Method:  Abdominal hysterectomy, cervical cancer Menstrual Calendar(currently): n/a  Current complaints: none.   Current acute medical issues:     Recent Gynecologic History No LMP recorded. Patient has had a hysterectomy. Last Pap: 2017,  normal Last mammogram: 12/31/2018normal  Past Medical History:  Diagnosis Date  . Anxiety   . Arthritis   . Cancer ([place])   . Headache(784.0)   . Hypercholesteremia     Past Surgical History:  Procedure Laterality Date  . ABDOMINAL HYSTERECTOMY    . COLONOSCOPY  04/05/2012   Procedure: COLONOSCOPY;  Surgeon: Rogene Houston, MD;  Location: [facility];  Service: Endoscopy;  Laterality: N/A;  930  . COLONOSCOPY N/A 08/17/2016   Procedure: COLONOSCOPY;  Surgeon: Danie Binder, MD;  Location: [facility];  Service: Endoscopy;  Laterality: N/A;  10:00am  . HEMORRHOID BANDING N/A 08/17/2016   Procedure: HEMORRHOID BANDING;  Surgeon: Danie Binder, MD;  Location: [facility];  Service: Endoscopy;  Laterality: N/A;  . INCISION AND DRAINAGE Right 08/01/2015   Procedure: INCISION AND DRAINAGE RIGHT RING FINGER;  Surgeon: Carole Civil, MD;  Location: [facility];  Service: Orthopedics;  Laterality: Right;    OB History    Gravida Para Term Preterm AB Living   1 1 1     1    SAB TAB Ectopic Multiple Live Births           1      Social History   Socioeconomic History  . Marital status: Single    Spouse name: None  . Number of children: None  . Years of education: None  . Highest education level: None  Social Needs  . Financial resource strain: None  . Food insecurity - worry: None  . Food insecurity - inability: None  . Transportation needs - medical: None  . Transportation needs - non-medical: None  Occupational History  . None  Tobacco Use  . Smoking status: Former Smoker   Packs/day: 1.00    Years: 44.00    Pack years: 44.00    Types: Cigarettes  . Smokeless tobacco: Never Used  Substance and Sexual Activity  . Alcohol use: No    Alcohol/week: 0.0 oz  . Drug use: No  . Sexual activity: Yes    Birth control/protection: Surgical    Comment: hyst  Other Topics Concern  . None  Social History Narrative  . None    Family History  Problem Relation Age of Onset  . Ovarian cancer Mother   . Bladder Cancer Father   . Lung cancer Sister   . Cancer Brother        liver  . Cancer Other   . Colon cancer Neg Hx      Current Outpatient Medications:  .  acetaminophen (TYLENOL) 500 MG tablet, Take 1,000 mg by mouth daily as needed for headache., Disp: , Rfl:  .  ALPRAZolam (XANAX) 1 MG tablet, Take 1 tablet (1 mg total) by mouth 3 (three) times daily., Disp: 90 tablet, Rfl: 3 .  augmented betamethasone dipropionate (DIPROLENE-AF) 0.05 % ointment, Apply 1 application topically 2 (two) times daily. , Disp: , Rfl: 3 .  Biotin 1000 MCG tablet, Take 1,000 mcg by mouth daily., Disp: , Rfl:  .  celecoxib (CELEBREX) 200 MG capsule, Take 200 mg by mouth  every evening. , Disp: , Rfl:  .  Cholecalciferol (VITAMIN D3 PO), Take 2,000 Units by mouth daily., Disp: , Rfl:  .  diphenhydrAMINE (BENADRYL) 25 mg capsule, Take 1 capsule (25 mg total) by mouth 3 (three) times daily. (Patient taking differently: Take 25 mg by mouth daily as needed for itching. ), Disp: 15 capsule, Rfl: 0 .  Doxylamine Succinate, Sleep, (SLEEP AID PO), Take 1 tablet by mouth at bedtime., Disp: , Rfl:  .  escitalopram (LEXAPRO) 20 MG tablet, Take 1 tablet (20 mg total) by mouth daily., Disp: 90 tablet, Rfl: 3 .  ezetimibe (ZETIA) 10 MG tablet, Take 1 tablet (10 mg total) by mouth daily., Disp: 90 tablet, Rfl: 3 .  lisinopril (PRINIVIL,ZESTRIL) 20 MG tablet, Take 20 mg by mouth daily., Disp: , Rfl: 6 .  Omega-3 Fatty Acids (FISH OIL PO), Take 1,200 mg by mouth daily., Disp: , Rfl:  .  Omega-3 Fatty  Acids (OMEGA 3 PO), Take 500 mg by mouth daily., Disp: , Rfl:  .  pyridOXINE (VITAMIN B-6) 100 MG tablet, Take 100 mg by mouth daily., Disp: , Rfl:  .  rosuvastatin (CRESTOR) 20 MG tablet, Take 20 mg by mouth every evening. , Disp: , Rfl:   Review of Systems  Review of Systems  Constitutional: Negative for fever, chills, weight loss, malaise/fatigue and diaphoresis.  HENT: Negative for hearing loss, ear pain, nosebleeds, congestion, sore throat, neck pain, tinnitus and ear discharge.   Eyes: Negative for blurred vision, double vision, photophobia, pain, discharge and redness.  Respiratory: Negative for cough, hemoptysis, sputum production, shortness of breath, wheezing and stridor.   Cardiovascular: Negative for chest pain, palpitations, orthopnea, claudication, leg swelling and PND.  Gastrointestinal: negative for abdominal pain. Negative for heartburn, nausea, vomiting, diarrhea, constipation, blood in stool and melena.  Genitourinary: Negative for dysuria, urgency, frequency, hematuria and flank pain.  Musculoskeletal: Negative for myalgias, back pain, joint pain and falls.  Skin: Negative for itching and rash.  Neurological: Negative for dizziness, tingling, tremors, sensory change, speech change, focal weakness, seizures, loss of consciousness, weakness and headaches.  Endo/Heme/Allergies: Negative for environmental allergies and polydipsia. Does not bruise/bleed easily.  Psychiatric/Behavioral: Negative for depression, suicidal ideas, hallucinations, memory loss and substance abuse. The patient is not nervous/anxious and does not have insomnia.        Objective:  Blood pressure (!) 170/100, pulse 66, height 5\' 2"  (1.575 m), weight 170 lb 8 oz (77.3 kg).   Physical Exam  Vitals reviewed. Constitutional: She is oriented to person, place, and time. She appears well-developed and well-nourished.  HENT:  Head: Normocephalic and atraumatic.        Right Ear: External ear normal.  Left  Ear: External ear normal.  Nose: Nose normal.  Mouth/Throat: Oropharynx is clear and moist.  Eyes: Conjunctivae and EOM are normal. Pupils are equal, round, and reactive to light. Right eye exhibits no discharge. Left eye exhibits no discharge. No scleral icterus.  Neck: Normal range of motion. Neck supple. No tracheal deviation present. No thyromegaly present.  Cardiovascular: Normal rate, regular rhythm, normal heart sounds and intact distal pulses.  Exam reveals no gallop and no friction rub.   No murmur heard. Respiratory: Effort normal and breath sounds normal. No respiratory distress. She has no wheezes. She has no rales. She exhibits no tenderness.  GI: Soft. Bowel sounds are normal. She exhibits no distension and no mass. There is no tenderness. There is no rebound and no guarding.  Genitourinary:  Breasts  no masses skin changes or nipple changes bilaterally      Vulva is normal without lesions Vagina is pink moist without discharge Cervix absent and pap is done due to history of cervical cancer Uterus is absent Adnexa is negative ovaries are absent {Rectal    hemoccult negative, normal tone, no masses  Musculoskeletal: Normal range of motion. She exhibits no edema and no tenderness.  Neurological: She is alert and oriented to person, place, and time. She has normal reflexes. She displays normal reflexes. No cranial nerve deficit. She exhibits normal muscle tone. Coordination normal.  Skin: Skin is warm and dry. No rash noted. No erythema. No pallor.  Psychiatric: She has a normal mood and affect. Her behavior is normal. Judgment and thought content normal.       Medications Ordered at today's visit: No orders of the defined types were placed in this encounter.   Other orders placed at today's visit: No orders of the defined types were placed in this encounter.     Assessment:    Healthy female exam.    Plan:    Contraception: status post hysterectomy. Mammogram  ordered. Follow up in: 1 year.     Return in about 1 year (around 05/22/2018) for yearly, with Dr Elonda Husky.

## 2017-05-24 LAB — CYTOLOGY - PAP
Diagnosis: NEGATIVE
HPV (WINDOPATH): NOT DETECTED

## 2017-06-02 ENCOUNTER — Encounter: Payer: Self-pay | Admitting: Family Medicine

## 2017-06-02 ENCOUNTER — Ambulatory Visit: Payer: BLUE CROSS/BLUE SHIELD | Admitting: Family Medicine

## 2017-06-02 VITALS — BP 121/78 | HR 79 | Temp 97.2°F | Ht 62.0 in | Wt 171.8 lb

## 2017-06-02 DIAGNOSIS — F419 Anxiety disorder, unspecified: Secondary | ICD-10-CM

## 2017-06-02 MED ORDER — ALPRAZOLAM 1 MG PO TABS: 1.0000 mg | ORAL_TABLET | Freq: Three times a day (TID) | ORAL | 3 refills | Status: DC

## 2017-06-02 NOTE — Progress Notes (Signed)
   HPI  Patient presents today seen today for anxiety.  Patient states that she feels like she is doing pretty well.  When she came to me from Dr. Emilee Hero on 4 times daily Xanax alone, we started Lexapro and she was able to reduce to 3 times daily Xanax. Symptoms seem well controlled.  She has had a recent mammogram and Pap smear.   PMH: Smoking status noted ROS: Per HPI  Objective: BP 121/78   Pulse 79   Temp (!) 97.2 F (36.2 C) (Oral)   Ht 5\' 2"  (1.575 m)   Wt 171 lb 12.8 oz (77.9 kg)   BMI 31.42 kg/m  Gen: NAD, alert, cooperative with exam HEENT: NCAT CV: RRR, good S1/S2, no murmur Resp: CTABL, no wheezes, non-labored Ext: No edema, warm Neuro: Alert and oriented, No gross deficits  Labs from November reviewed  Assessment and plan:  #Anxiety Doing well with Lexapro plus Xanax, refill times 4 months Follow-up in 4 months.    Meds ordered this encounter  Medications  . ALPRAZolam (XANAX) 1 MG tablet    Sig: Take 1 tablet (1 mg total) by mouth 3 (three) times daily.    Dispense:  90 tablet    Refill:  [place], MD [facility] 06/02/2017, 4:11 PM

## 2017-06-02 NOTE — Patient Instructions (Signed)
Great to see you!  Come back in 4 months unless you need us sooner.    

## 2017-06-06 ENCOUNTER — Other Ambulatory Visit: Payer: Self-pay | Admitting: *Deleted

## 2017-06-06 MED ORDER — CELECOXIB 200 MG PO CAPS: 200.0000 mg | ORAL_CAPSULE | Freq: Every evening | ORAL | 1 refills | Status: DC

## 2017-06-06 MED ORDER — ROSUVASTATIN CALCIUM 20 MG PO TABS: 20.0000 mg | ORAL_TABLET | Freq: Every evening | ORAL | 3 refills | Status: DC

## 2017-06-28 ENCOUNTER — Encounter: Payer: Self-pay | Admitting: Family Medicine

## 2017-06-28 DIAGNOSIS — E785 Hyperlipidemia, unspecified: Secondary | ICD-10-CM | POA: Diagnosis not present

## 2017-06-28 DIAGNOSIS — E559 Vitamin D deficiency, unspecified: Secondary | ICD-10-CM | POA: Diagnosis not present

## 2017-06-28 DIAGNOSIS — Z79899 Other long term (current) drug therapy: Secondary | ICD-10-CM | POA: Diagnosis not present

## 2017-06-28 DIAGNOSIS — Z139 Encounter for screening, unspecified: Secondary | ICD-10-CM | POA: Diagnosis not present

## 2017-07-12 DIAGNOSIS — E785 Hyperlipidemia, unspecified: Secondary | ICD-10-CM | POA: Diagnosis not present

## 2017-07-12 DIAGNOSIS — Z008 Encounter for other general examination: Secondary | ICD-10-CM | POA: Diagnosis not present

## 2017-07-12 DIAGNOSIS — I10 Essential (primary) hypertension: Secondary | ICD-10-CM | POA: Diagnosis not present

## 2017-07-12 DIAGNOSIS — Z719 Counseling, unspecified: Secondary | ICD-10-CM | POA: Diagnosis not present

## 2017-10-03 ENCOUNTER — Encounter: Payer: Self-pay | Admitting: Family Medicine

## 2017-10-03 ENCOUNTER — Ambulatory Visit: Payer: BLUE CROSS/BLUE SHIELD | Admitting: Family Medicine

## 2017-10-03 VITALS — BP 147/83 | HR 82 | Temp 97.2°F | Ht 62.0 in | Wt 173.6 lb

## 2017-10-03 DIAGNOSIS — R252 Cramp and spasm: Secondary | ICD-10-CM

## 2017-10-03 DIAGNOSIS — R0683 Snoring: Secondary | ICD-10-CM | POA: Diagnosis not present

## 2017-10-03 DIAGNOSIS — F419 Anxiety disorder, unspecified: Secondary | ICD-10-CM

## 2017-10-03 MED ORDER — CELECOXIB 200 MG PO CAPS: 200.0000 mg | ORAL_CAPSULE | Freq: Every evening | ORAL | 1 refills | Status: DC

## 2017-10-03 MED ORDER — ALPRAZOLAM 1 MG PO TABS: 1.0000 mg | ORAL_TABLET | Freq: Three times a day (TID) | ORAL | 3 refills | Status: DC

## 2017-10-03 MED ORDER — EZETIMIBE 10 MG PO TABS: 10.0000 mg | ORAL_TABLET | Freq: Every day | ORAL | 3 refills | Status: DC

## 2017-10-03 MED ORDER — LISINOPRIL 20 MG PO TABS: 20.0000 mg | ORAL_TABLET | Freq: Every day | ORAL | 3 refills | Status: DC

## 2017-10-03 MED ORDER — ESCITALOPRAM OXALATE 20 MG PO TABS: 20.0000 mg | ORAL_TABLET | Freq: Every day | ORAL | 1 refills | Status: DC

## 2017-10-03 NOTE — Progress Notes (Signed)
   HPI  Patient presents today to discuss anxiety, leg cramps,  Anxiety Doing well with Lexapro plus Xanax, refills needed.  Snoring Denies periods of apnea, her boyfriend states that she snores loudly, and does wax and wane. She does have a congested stuffy nose every night. Feels well rested overall in the morning.  Leg cramps Bilateral legs, cramping at night, no aggravating or alleviating factors. Recent B12 levels checked at work were almost 1000  PMH: Smoking status noted ROS: Per HPI  Objective: BP (!) 147/83   Pulse 82   Temp (!) 97.2 F (36.2 C) (Oral)   Ht '5\' 2"'$  (1.575 m)   Wt 173 lb 9.6 oz (78.7 kg)   BMI 31.75 kg/m  Gen: NAD, alert, cooperative with exam HEENT: NCAT CV: RRR, good S1/S2, no murmur Resp: CTABL, no wheezes, non-labored Ext: No edema, warm Neuro: Alert and oriented, No gross deficits  Assessment and plan:  #Anxiety Stable, doing well, refilled Xanax, continue Lexapro   #Snoring Likely due to nasal congestion Recommended nasal saline rinses with neti pot  #Nocturnal leg cramps Discussed complex nature and unclear etiology of these Labs for possible electrolyte abnormality B complex vitamin recommended   Orders Placed This Encounter  Procedures  . BMP8+EGFR    Meds ordered this encounter  Medications  . DISCONTD: ALPRAZolam (XANAX) 1 MG tablet    Sig: Take 1 tablet (1 mg total) by mouth 3 (three) times daily.    Dispense:  90 tablet    Refill:  3  . celecoxib (CELEBREX) 200 MG capsule    Sig: Take 1 capsule (200 mg total) by mouth every evening.    Dispense:  90 capsule    Refill:  1  . escitalopram (LEXAPRO) 20 MG tablet    Sig: Take 1 tablet (20 mg total) by mouth daily.    Dispense:  90 tablet    Refill:  1  . ezetimibe (ZETIA) 10 MG tablet    Sig: Take 1 tablet (10 mg total) by mouth daily.    Dispense:  90 tablet    Refill:  3  . lisinopril (PRINIVIL,ZESTRIL) 20 MG tablet    Sig: Take 1 tablet (20 mg total) by  mouth daily.    Dispense:  90 tablet    Refill:  3  . ALPRAZolam (XANAX) 1 MG tablet    Sig: Take 1 tablet (1 mg total) by mouth 3 (three) times daily.    Dispense:  90 tablet    Refill:  [place], MD [facility] 10/03/2017, 3:57 PM

## 2017-10-03 NOTE — Patient Instructions (Signed)
Great to see you!  Consider Bcomplex vitamin for leg cramps  Consider  Neti pot for your nose and snoring, we talked about the squeeze bottle from [employer], don't use well water with this.

## 2017-10-04 LAB — BMP8+EGFR
BUN / CREAT RATIO: 20 (ref 12–28)
BUN: 12 mg/dL (ref 8–27)
CALCIUM: 9.3 mg/dL (ref 8.7–10.3)
CHLORIDE: 105 mmol/L (ref 96–106)
CO2: 23 mmol/L (ref 20–29)
CREATININE: 0.61 mg/dL (ref 0.57–1.00)
GFR calc Af Amer: 111 mL/min/{1.73_m2} (ref >59)
GFR calc non Af Amer: 96 mL/min/{1.73_m2} (ref >59)
Glucose: 83 mg/dL (ref 65–99)
Potassium: 4.3 mmol/L (ref 3.5–5.2)
Sodium: 141 mmol/L (ref 134–144)

## 2018-01-01 ENCOUNTER — Ambulatory Visit (INDEPENDENT_AMBULATORY_CARE_PROVIDER_SITE_OTHER): Payer: BLUE CROSS/BLUE SHIELD

## 2018-01-01 ENCOUNTER — Ambulatory Visit: Payer: BLUE CROSS/BLUE SHIELD | Admitting: Family

## 2018-01-01 ENCOUNTER — Encounter: Payer: Self-pay | Admitting: Family

## 2018-01-01 VITALS — BP 137/89 | HR 97 | Temp 97.2°F | Ht 62.0 in | Wt 173.6 lb

## 2018-01-01 DIAGNOSIS — S9031XA Contusion of right foot, initial encounter: Secondary | ICD-10-CM | POA: Diagnosis not present

## 2018-01-01 DIAGNOSIS — M25571 Pain in right ankle and joints of right foot: Secondary | ICD-10-CM | POA: Diagnosis not present

## 2018-01-01 DIAGNOSIS — S99911A Unspecified injury of right ankle, initial encounter: Secondary | ICD-10-CM | POA: Diagnosis not present

## 2018-01-01 NOTE — Patient Instructions (Signed)
Contusion A contusion is a deep bruise. Contusions are the result of a blunt injury to tissues and muscle fibers under the skin. The injury causes bleeding under the skin. The skin overlying the contusion may turn blue, purple, or yellow. Minor injuries will give you a painless contusion, but more severe contusions may stay painful and swollen for a few weeks. What are the causes? This condition is usually caused by a blow, trauma, or direct force to an area of the body. What are the signs or symptoms? Symptoms of this condition include:  Swelling of the injured area.  Pain and tenderness in the injured area.  Discoloration. The area may have redness and then turn blue, purple, or yellow.  How is this diagnosed? This condition is diagnosed based on a physical exam and medical history. An X-ray, CT scan, or MRI may be needed to determine if there are any associated injuries, such as broken bones (fractures). How is this treated? Specific treatment for this condition depends on what area of the body was injured. In general, the best treatment for a contusion is resting, icing, applying pressure to (compression), and elevating the injured area. This is often called the RICE strategy. Over-the-counter anti-inflammatory medicines may also be recommended for pain control. Follow these instructions at home:  Rest the injured area.  If directed, apply ice to the injured area: ? Put ice in a plastic bag. ? Place a towel between your skin and the bag. ? Leave the ice on for 20 minutes, 2-3 times per day.  If directed, apply light compression to the injured area using an elastic bandage. Make sure the bandage is not wrapped too tightly. Remove and reapply the bandage as directed by your health care provider.  If possible, raise (elevate) the injured area above the level of your heart while you are sitting or lying down.  Take over-the-counter and prescription medicines only as told by your health  care provider. Contact a health care provider if:  Your symptoms do not improve after several days of treatment.  Your symptoms get worse.  You have difficulty moving the injured area. Get help right away if:  You have severe pain.  You have numbness in a hand or foot.  Your hand or foot turns pale or cold. This information is not intended to replace advice given to you by your health care provider. Make sure you discuss any questions you have with your health care provider. Document Released: 01/19/2005 Document Revised: 08/20/2015 Document Reviewed: 08/27/2014 Elsevier Interactive Patient Education  2018 [employer]  

## 2018-01-01 NOTE — Progress Notes (Signed)
   Subjective:    Patient ID: Joyce [name] [facility], female    DOB: October 29, 1955, 62 y.o.   MRN: [id number]  Chief Complaint  Patient presents with  . right ankle pain    Ankle Pain   The incident occurred more than 1 week ago. The incident occurred in the yard. The injury mechanism was a fall. The pain is present in the right foot. The quality of the pain is described as aching. The pain is at a severity of 8/10. The pain is moderate. The pain has been constant since onset. Pertinent negatives include no numbness or tingling. She reports no foreign bodies present. The symptoms are aggravated by weight bearing. She has tried rest and NSAIDs for the symptoms. The treatment provided mild relief.      Review of Systems  Neurological: Negative for tingling and numbness.  All other systems reviewed and are negative.      Objective:   Physical Exam  Constitutional: She is oriented to person, place, and time. She appears well-developed and well-nourished. No distress.  HENT:  Head: Normocephalic.  Eyes: Pupils are equal, round, and reactive to light.  Neck: Normal range of motion. Neck supple. No thyromegaly present.  Cardiovascular: Normal rate, regular rhythm, normal heart sounds and intact distal pulses.  No murmur heard. Pulmonary/Chest: Effort normal and breath sounds normal. No respiratory distress. She has no wheezes.  Abdominal: Soft. Bowel sounds are normal. She exhibits no distension. There is no tenderness.  Musculoskeletal: She exhibits edema and tenderness.  ecchymosis on foot   Neurological: She is alert and oriented to person, place, and time. She has normal reflexes. No cranial nerve deficit.  Skin: Skin is warm and dry.     Psychiatric: She has a normal mood and affect. Her behavior is normal. Judgment and thought content normal.  Vitals reviewed.   X-ray- Negative, Preliminary reading by Evelina Dun, FNP [facility]    BP 137/89   Pulse 97   Temp (!) 97.2 F (36.2 C)  (Oral)   Ht 5\' 2"  (1.575 m)   Wt 173 lb 9.6 oz (78.7 kg)   BMI 31.75 kg/m      Assessment & Plan:  Joyce [name] comes in today with chief complaint of right ankle pain   Diagnosis and orders addressed:  1. Acute right ankle pain - DG Ankle Complete Right; Future  2. Contusion of right foot, initial encounter Rest Ice Keep elevated  RTO if symptoms worsen or do not improve    Evelina Dun, FNP

## 2018-01-02 ENCOUNTER — Ambulatory Visit: Payer: BLUE CROSS/BLUE SHIELD | Admitting: Family

## 2018-01-03 DIAGNOSIS — Z0130 Encounter for examination of blood pressure without abnormal findings: Secondary | ICD-10-CM | POA: Diagnosis not present

## 2018-01-03 DIAGNOSIS — Z139 Encounter for screening, unspecified: Secondary | ICD-10-CM | POA: Diagnosis not present

## 2018-01-03 DIAGNOSIS — E785 Hyperlipidemia, unspecified: Secondary | ICD-10-CM | POA: Diagnosis not present

## 2018-01-03 DIAGNOSIS — Z79899 Other long term (current) drug therapy: Secondary | ICD-10-CM | POA: Diagnosis not present

## 2018-01-16 DIAGNOSIS — Z23 Encounter for immunization: Secondary | ICD-10-CM | POA: Diagnosis not present

## 2018-01-25 DIAGNOSIS — L03031 Cellulitis of right toe: Secondary | ICD-10-CM | POA: Diagnosis not present

## 2018-01-25 DIAGNOSIS — L03032 Cellulitis of left toe: Secondary | ICD-10-CM | POA: Diagnosis not present

## 2018-01-29 DIAGNOSIS — I10 Essential (primary) hypertension: Secondary | ICD-10-CM | POA: Diagnosis not present

## 2018-01-29 DIAGNOSIS — Z719 Counseling, unspecified: Secondary | ICD-10-CM | POA: Diagnosis not present

## 2018-01-29 DIAGNOSIS — Z008 Encounter for other general examination: Secondary | ICD-10-CM | POA: Diagnosis not present

## 2018-01-29 DIAGNOSIS — E785 Hyperlipidemia, unspecified: Secondary | ICD-10-CM | POA: Diagnosis not present

## 2018-02-01 ENCOUNTER — Encounter: Payer: Self-pay | Admitting: Family

## 2018-02-01 ENCOUNTER — Ambulatory Visit: Payer: BLUE CROSS/BLUE SHIELD | Admitting: Family

## 2018-02-01 VITALS — BP 121/77 | HR 87 | Temp 97.9°F | Ht 62.0 in | Wt 172.0 lb

## 2018-02-01 DIAGNOSIS — M150 Primary generalized (osteo)arthritis: Secondary | ICD-10-CM

## 2018-02-01 DIAGNOSIS — F419 Anxiety disorder, unspecified: Secondary | ICD-10-CM

## 2018-02-01 DIAGNOSIS — I10 Essential (primary) hypertension: Secondary | ICD-10-CM | POA: Diagnosis not present

## 2018-02-01 DIAGNOSIS — M1990 Unspecified osteoarthritis, unspecified site: Secondary | ICD-10-CM | POA: Insufficient documentation

## 2018-02-01 DIAGNOSIS — Z79899 Other long term (current) drug therapy: Secondary | ICD-10-CM | POA: Diagnosis not present

## 2018-02-01 DIAGNOSIS — E7800 Pure hypercholesterolemia, unspecified: Secondary | ICD-10-CM | POA: Diagnosis not present

## 2018-02-01 DIAGNOSIS — M159 Polyosteoarthritis, unspecified: Secondary | ICD-10-CM

## 2018-02-01 DIAGNOSIS — E669 Obesity, unspecified: Secondary | ICD-10-CM | POA: Insufficient documentation

## 2018-02-01 DIAGNOSIS — F1320 Sedative, hypnotic or anxiolytic dependence, uncomplicated: Secondary | ICD-10-CM

## 2018-02-01 MED ORDER — LISINOPRIL 20 MG PO TABS: 20.0000 mg | ORAL_TABLET | Freq: Every day | ORAL | 3 refills | Status: DC

## 2018-02-01 MED ORDER — ALPRAZOLAM 1 MG PO TABS: 1.0000 mg | ORAL_TABLET | Freq: Three times a day (TID) | ORAL | 3 refills | Status: DC

## 2018-02-01 NOTE — Patient Instructions (Signed)

## 2018-02-01 NOTE — Progress Notes (Signed)
Subjective:    Patient ID: Joyce [name] [facility], female    DOB: [birth year], 62 y.o.   MRN: [id number]  Chief Complaint  Patient presents with  . Medical Management of Chronic Issues    four month recheck   PT presents to the office today for chronic follow up.  Hypertension  This is a chronic problem. The current episode started more than 1 year ago. The problem has been resolved since onset. The problem is controlled. Associated symptoms include anxiety. Pertinent negatives include no malaise/fatigue, peripheral edema or shortness of breath. Risk factors for coronary artery disease include dyslipidemia, obesity, sedentary lifestyle and family history. Past treatments include ACE inhibitors. The current treatment provides moderate improvement.  Hyperlipidemia  This is a chronic problem. The current episode started more than 1 year ago. The problem is uncontrolled. Recent lipid tests were reviewed and are high. Exacerbating diseases include obesity. Pertinent negatives include no shortness of breath. Current antihyperlipidemic treatment includes statins. The current treatment provides moderate improvement of lipids. Risk factors for coronary artery disease include diabetes mellitus, hypertension, a sedentary lifestyle and post-menopausal.  Anxiety  Presents for follow-up visit. Symptoms include dizziness, excessive worry, irritability and nervous/anxious behavior. Patient reports no shortness of breath. Symptoms occur occasionally. The severity of symptoms is moderate. The quality of sleep is good.    Arthritis  Presents for follow-up visit. Affected locations include the right knee, left knee, left shoulder, right shoulder, right MCP and left MCP. Her pain is at a severity of 5/10.      Review of Systems  Constitutional: Positive for irritability. Negative for malaise/fatigue.  Respiratory: Negative for shortness of breath.   Musculoskeletal: Positive for arthritis.  Neurological: Positive  for dizziness.  Psychiatric/Behavioral: The patient is nervous/anxious.   All other systems reviewed and are negative.      Objective:   Physical Exam  Constitutional: She is oriented to person, place, and time. She appears well-developed and well-nourished. No distress.  HENT:  Head: Normocephalic and atraumatic.  Right Ear: External ear normal.  Left Ear: External ear normal.  Mouth/Throat: Oropharynx is clear and moist.  Eyes: Pupils are equal, round, and reactive to light.  Neck: Normal range of motion. Neck supple. No thyromegaly present.  Cardiovascular: Normal rate, regular rhythm, normal heart sounds and intact distal pulses.  No murmur heard. Pulmonary/Chest: Effort normal and breath sounds normal. No respiratory distress. She has no wheezes.  Abdominal: Soft. Bowel sounds are normal. She exhibits no distension. There is no tenderness.  Musculoskeletal: Normal range of motion. She exhibits no edema or tenderness.  Neurological: She is alert and oriented to person, place, and time. She has normal reflexes. No cranial nerve deficit.  Skin: Skin is warm and dry.  Bilateral palms dry, cracked, and peeling   Psychiatric: She has a normal mood and affect. Her behavior is normal. Judgment and thought content normal.  Vitals reviewed.     BP 121/77   Pulse 87   Temp 97.9 F (36.6 C) (Oral)   Ht 5\' 2"  (1.575 m)   Wt 172 lb (78 kg)   BMI 31.46 kg/m      Assessment & Plan:  Champayne Kocian Cater comes in today with chief complaint of Medical Management of Chronic Issues (four month recheck)   Diagnosis and orders addressed:  1. Essential hypertension - lisinopril (PRINIVIL,ZESTRIL) 20 MG tablet; Take 1 tablet (20 mg total) by mouth daily.  Dispense: 90 tablet; Refill: 3  2. Anxiety - ALPRAZolam Duanne Moron)  1 MG tablet; Take 1 tablet (1 mg total) by mouth 3 (three) times daily.  Dispense: 90 tablet; Refill: 3 - ToxASSURE Select 13 (MW), Urine  3. Primary osteoarthritis  involving multiple joints  4. Hypercholesteremia  5. Obesity (BMI 30-39.9)  6. Controlled substance agreement signed - ToxASSURE Select 13 (MW), Urine  7. Benzodiazepine dependence ([place]) - ToxASSURE Select 13 (MW), Urine   Labs pending Pt reviewed in [place] controlled database, pt has only received controlled medications from Previous PCP Health Maintenance reviewed Diet and exercise encouraged  Follow up plan: 4 months    Evelina Dun, FNP

## 2018-02-05 ENCOUNTER — Ambulatory Visit: Payer: BLUE CROSS/BLUE SHIELD | Admitting: Family

## 2018-02-07 LAB — TOXASSURE SELECT 13 (MW), URINE

## 2018-02-08 ENCOUNTER — Other Ambulatory Visit: Payer: Self-pay | Admitting: Family

## 2018-02-08 DIAGNOSIS — M79676 Pain in unspecified toe(s): Secondary | ICD-10-CM | POA: Diagnosis not present

## 2018-02-08 DIAGNOSIS — L03032 Cellulitis of left toe: Secondary | ICD-10-CM | POA: Diagnosis not present

## 2018-02-08 DIAGNOSIS — L03031 Cellulitis of right toe: Secondary | ICD-10-CM | POA: Diagnosis not present

## 2018-02-22 DIAGNOSIS — B351 Tinea unguium: Secondary | ICD-10-CM | POA: Diagnosis not present

## 2018-02-22 DIAGNOSIS — M79676 Pain in unspecified toe(s): Secondary | ICD-10-CM | POA: Diagnosis not present

## 2018-02-26 ENCOUNTER — Other Ambulatory Visit: Payer: Self-pay | Admitting: *Deleted

## 2018-02-26 MED ORDER — ESCITALOPRAM OXALATE 20 MG PO TABS: 20.0000 mg | ORAL_TABLET | Freq: Every day | ORAL | 1 refills | Status: DC

## 2018-02-26 MED ORDER — CELECOXIB 200 MG PO CAPS: 200.0000 mg | ORAL_CAPSULE | Freq: Every evening | ORAL | 0 refills | Status: DC

## 2018-02-26 NOTE — Addendum Note (Signed)
Addended by: Antonietta Barcelona D on: 02/26/2018 02:41 PM   Modules accepted: Orders

## 2018-05-14 DIAGNOSIS — J069 Acute upper respiratory infection, unspecified: Secondary | ICD-10-CM | POA: Diagnosis not present

## 2018-05-14 DIAGNOSIS — E785 Hyperlipidemia, unspecified: Secondary | ICD-10-CM | POA: Diagnosis not present

## 2018-05-14 DIAGNOSIS — Z008 Encounter for other general examination: Secondary | ICD-10-CM | POA: Diagnosis not present

## 2018-05-14 DIAGNOSIS — I10 Essential (primary) hypertension: Secondary | ICD-10-CM | POA: Diagnosis not present

## 2018-05-14 DIAGNOSIS — E559 Vitamin D deficiency, unspecified: Secondary | ICD-10-CM | POA: Diagnosis not present

## 2018-05-22 ENCOUNTER — Other Ambulatory Visit: Payer: Self-pay | Admitting: *Deleted

## 2018-05-22 MED ORDER — ROSUVASTATIN CALCIUM 20 MG PO TABS: 20.0000 mg | ORAL_TABLET | Freq: Every evening | ORAL | 3 refills | Status: DC

## 2018-06-04 ENCOUNTER — Encounter: Payer: Self-pay | Admitting: Family

## 2018-06-04 ENCOUNTER — Ambulatory Visit (INDEPENDENT_AMBULATORY_CARE_PROVIDER_SITE_OTHER): Payer: Medicare Other | Admitting: Family

## 2018-06-04 VITALS — BP 100/71 | HR 83 | Temp 97.1°F | Ht 62.0 in | Wt 171.8 lb

## 2018-06-04 DIAGNOSIS — M150 Primary generalized (osteo)arthritis: Secondary | ICD-10-CM | POA: Diagnosis not present

## 2018-06-04 DIAGNOSIS — Z1159 Encounter for screening for other viral diseases: Secondary | ICD-10-CM

## 2018-06-04 DIAGNOSIS — E669 Obesity, unspecified: Secondary | ICD-10-CM

## 2018-06-04 DIAGNOSIS — E7800 Pure hypercholesterolemia, unspecified: Secondary | ICD-10-CM | POA: Diagnosis not present

## 2018-06-04 DIAGNOSIS — Z79899 Other long term (current) drug therapy: Secondary | ICD-10-CM

## 2018-06-04 DIAGNOSIS — F419 Anxiety disorder, unspecified: Secondary | ICD-10-CM | POA: Diagnosis not present

## 2018-06-04 DIAGNOSIS — I10 Essential (primary) hypertension: Secondary | ICD-10-CM | POA: Diagnosis not present

## 2018-06-04 DIAGNOSIS — F1320 Sedative, hypnotic or anxiolytic dependence, uncomplicated: Secondary | ICD-10-CM

## 2018-06-04 DIAGNOSIS — M159 Polyosteoarthritis, unspecified: Secondary | ICD-10-CM

## 2018-06-04 MED ORDER — ROSUVASTATIN CALCIUM 20 MG PO TABS: 20.0000 mg | ORAL_TABLET | Freq: Every evening | ORAL | 3 refills | Status: DC

## 2018-06-04 MED ORDER — ESCITALOPRAM OXALATE 20 MG PO TABS: 20.0000 mg | ORAL_TABLET | Freq: Every day | ORAL | 1 refills | Status: DC

## 2018-06-04 MED ORDER — ALPRAZOLAM 1 MG PO TABS: 1.0000 mg | ORAL_TABLET | Freq: Three times a day (TID) | ORAL | 5 refills | Status: DC

## 2018-06-04 MED ORDER — CELECOXIB 200 MG PO CAPS: 200.0000 mg | ORAL_CAPSULE | Freq: Every evening | ORAL | 2 refills | Status: DC

## 2018-06-04 MED ORDER — LISINOPRIL 20 MG PO TABS: 20.0000 mg | ORAL_TABLET | Freq: Every day | ORAL | 3 refills | Status: DC

## 2018-06-04 MED ORDER — EZETIMIBE 10 MG PO TABS: 10.0000 mg | ORAL_TABLET | Freq: Every day | ORAL | 3 refills | Status: DC

## 2018-06-04 NOTE — Progress Notes (Signed)
Subjective:     Patient ID: Joyce [name] [facility], female    DOB: May 18, 1955, 63 y.o.   MRN: [id number]  No chief complaint on file.  Pt presents to the office today for chronic follow up.  Hypertension  This is a chronic problem. The current episode started more than 1 year ago. The problem has been resolved since onset. The problem is controlled. Associated symptoms include anxiety. Pertinent negatives include no peripheral edema or shortness of breath. Risk factors for coronary artery disease include dyslipidemia, obesity and sedentary lifestyle. The current treatment provides moderate improvement. There is no history of kidney disease, CAD/MI, CVA or heart failure.  Arthritis  Presents for follow-up visit. She reports no pain or stiffness. Affected locations include the right knee and left knee. Her pain is at a severity of 4/10. Pertinent negatives include no fever.  Hyperlipidemia  This is a chronic problem. The current episode started more than 1 year ago. The problem is controlled. Recent lipid tests were reviewed and are normal. Pertinent negatives include no shortness of breath. Current antihyperlipidemic treatment includes statins and ezetimibe. The current treatment provides moderate improvement of lipids. Risk factors for coronary artery disease include dyslipidemia, hypertension and a sedentary lifestyle.  Anxiety  Presents for follow-up visit. Symptoms include depressed mood, excessive worry, irritability and nervous/anxious behavior. Patient reports no shortness of breath. Symptoms occur most days. The severity of symptoms is moderate. The quality of sleep is good.    Cough  This is a new problem. The current episode started 1 to 4 weeks ago. The problem has been gradually worsening. The problem occurs every few minutes. Associated symptoms include nasal congestion. Pertinent negatives include no chills, ear congestion, ear pain, fever or shortness of breath. She has tried rest and OTC  cough suppressant for the symptoms. The treatment provided mild relief.      Review of Systems  Constitutional: Positive for irritability. Negative for chills and fever.  HENT: Negative for ear pain.   Respiratory: Positive for cough. Negative for shortness of breath.   Musculoskeletal: Positive for arthritis. Negative for stiffness.  Psychiatric/Behavioral: The patient is nervous/anxious.   All other systems reviewed and are negative.      Objective:   Physical Exam Vitals signs reviewed.  Constitutional:      General: She is not in acute distress.    Appearance: She is well-developed.  HENT:     Head: Normocephalic and atraumatic.     Right Ear: Tympanic membrane normal.     Left Ear: Tympanic membrane normal.  Eyes:     Pupils: Pupils are equal, round, and reactive to light.  Neck:     Musculoskeletal: Normal range of motion and neck supple.     Thyroid: No thyromegaly.  Cardiovascular:     Rate and Rhythm: Normal rate and regular rhythm.     Heart sounds: Normal heart sounds. No murmur.  Pulmonary:     Effort: Pulmonary effort is normal. No respiratory distress.     Breath sounds: Normal breath sounds. No wheezing.  Abdominal:     General: Bowel sounds are normal. There is no distension.     Palpations: Abdomen is soft.     Tenderness: There is no abdominal tenderness.  Musculoskeletal: Normal range of motion.        General: No tenderness.  Skin:    General: Skin is warm and dry.  Neurological:     Mental Status: She is alert and oriented to person, place,  and time.     Cranial Nerves: No cranial nerve deficit.     Deep Tendon Reflexes: Reflexes are normal and symmetric.  Psychiatric:        Behavior: Behavior normal.        Thought Content: Thought content normal.        Judgment: Judgment normal.     BP 100/71   Pulse 83   Temp (!) 97.1 F (36.2 C) (Oral)   Ht _0  (1.575 m)   Wt 171 lb 12.8 oz (77.9 kg)   BMI 31.42 kg/m      Assessment & Plan:   Haelee Bolen Casasola comes in today with chief complaint of No chief complaint on file.   Diagnosis and orders addressed:  1. Essential hypertension - CMP14+EGFR - CBC with Differential/Platelet - lisinopril (PRINIVIL,ZESTRIL) 20 MG tablet; Take 1 tablet (20 mg total) by mouth daily.  Dispense: 90 tablet; Refill: 3  2. Primary osteoarthritis involving multiple joints - CMP14+EGFR - CBC with Differential/Platelet - celecoxib (CELEBREX) 200 MG capsule; Take 1 capsule (200 mg total) by mouth every evening.  Dispense: 90 capsule; Refill: 2  3. Hypercholesteremia - CMP14+EGFR - CBC with Differential/Platelet - Lipid panel - rosuvastatin (CRESTOR) 20 MG tablet; Take 1 tablet (20 mg total) by mouth every evening.  Dispense: 90 tablet; Refill: 3  4. Anxiety - CMP14+EGFR - CBC with Differential/Platelet - ALPRAZolam (XANAX) 1 MG tablet; Take 1 tablet (1 mg total) by mouth 3 (three) times daily.  Dispense: 90 tablet; Refill: 5 - escitalopram (LEXAPRO) 20 MG tablet; Take 1 tablet (20 mg total) by mouth daily.  Dispense: 90 tablet; Refill: 1  5. Obesity (BMI 30-39.9) - CMP14+EGFR - CBC with Differential/Platelet  6. Controlled substance agreement signed - CMP14+EGFR - CBC with Differential/Platelet  7. Benzodiazepine dependence (HCC) - CMP14+EGFR - CBC with Differential/Platelet  8. Need for hepatitis C screening test - CMP14+EGFR - CBC with Differential/Platelet - Hepatitis C antibody   Labs pending Health Maintenance reviewed Diet and exercise encouraged  Follow up plan: 6 months    Evelina Dun, FNP

## 2018-06-04 NOTE — Patient Instructions (Signed)

## 2018-06-05 LAB — CMP14+EGFR
A/G RATIO: 2 (ref 1.2–2.2)
ALT: 26 IU/L (ref 0–32)
AST: 24 IU/L (ref 0–40)
Albumin: 4.1 g/dL (ref 3.8–4.8)
Alkaline Phosphatase: 83 IU/L (ref 39–117)
BUN/Creatinine Ratio: 20 (ref 12–28)
BUN: 14 mg/dL (ref 8–27)
Bilirubin Total: 0.3 mg/dL (ref 0.0–1.2)
CHLORIDE: 105 mmol/L (ref 96–106)
CO2: 22 mmol/L (ref 20–29)
Calcium: 9 mg/dL (ref 8.7–10.3)
Creatinine, Ser: 0.71 mg/dL (ref 0.57–1.00)
GFR calc Af Amer: 104 mL/min/{1.73_m2} (ref >59)
GFR calc non Af Amer: 90 mL/min/{1.73_m2} (ref >59)
GLUCOSE: 79 mg/dL (ref 65–99)
Globulin, Total: 2.1 g/dL (ref 1.5–4.5)
POTASSIUM: 4.4 mmol/L (ref 3.5–5.2)
Sodium: 144 mmol/L (ref 134–144)
Total Protein: 6.2 g/dL (ref 6.0–8.5)

## 2018-06-05 LAB — CBC WITH DIFFERENTIAL/PLATELET
BASOS ABS: 0 10*3/uL (ref 0.0–0.2)
Basos: 0 %
EOS (ABSOLUTE): 0.1 10*3/uL (ref 0.0–0.4)
Eos: 1 %
HEMOGLOBIN: 13.3 g/dL (ref 11.1–15.9)
Hematocrit: 39.5 % (ref 34.0–46.6)
IMMATURE GRANS (ABS): 0 10*3/uL (ref 0.0–0.1)
IMMATURE GRANULOCYTES: 0 %
LYMPHS: 26 %
Lymphocytes Absolute: 2.5 10*3/uL (ref 0.7–3.1)
MCH: 30.2 pg (ref 26.6–33.0)
MCHC: 33.7 g/dL (ref 31.5–35.7)
MCV: 90 fL (ref 79–97)
MONOCYTES: 6 %
Monocytes Absolute: 0.6 10*3/uL (ref 0.1–0.9)
NEUTROS PCT: 67 %
Neutrophils Absolute: 6.4 10*3/uL (ref 1.4–7.0)
PLATELETS: 304 10*3/uL (ref 150–450)
RBC: 4.4 x10E6/uL (ref 3.77–5.28)
RDW: 11.9 % (ref 11.7–15.4)
WBC: 9.7 10*3/uL (ref 3.4–10.8)

## 2018-06-05 LAB — LIPID PANEL
CHOLESTEROL TOTAL: 123 mg/dL (ref 100–199)
Chol/HDL Ratio: 2.6 ratio (ref 0.0–4.4)
HDL: 48 mg/dL (ref >39)
LDL Calculated: 49 mg/dL (ref 0–99)
TRIGLYCERIDES: 128 mg/dL (ref 0–149)
VLDL CHOLESTEROL CAL: 26 mg/dL (ref 5–40)

## 2018-06-05 LAB — HEPATITIS C ANTIBODY: Hep C Virus Ab: <0.1 s/co ratio (ref 0.0–0.9)

## 2018-10-02 ENCOUNTER — Ambulatory Visit: Payer: Medicare Other | Admitting: Family

## 2018-11-06 ENCOUNTER — Encounter: Payer: Self-pay | Admitting: Family

## 2018-11-06 ENCOUNTER — Ambulatory Visit (INDEPENDENT_AMBULATORY_CARE_PROVIDER_SITE_OTHER): Payer: Medicare Other | Admitting: Family

## 2018-11-06 DIAGNOSIS — L409 Psoriasis, unspecified: Secondary | ICD-10-CM | POA: Diagnosis not present

## 2018-11-06 DIAGNOSIS — I10 Essential (primary) hypertension: Secondary | ICD-10-CM

## 2018-11-06 DIAGNOSIS — Z79899 Other long term (current) drug therapy: Secondary | ICD-10-CM

## 2018-11-06 DIAGNOSIS — E669 Obesity, unspecified: Secondary | ICD-10-CM | POA: Diagnosis not present

## 2018-11-06 DIAGNOSIS — E7800 Pure hypercholesterolemia, unspecified: Secondary | ICD-10-CM

## 2018-11-06 DIAGNOSIS — M150 Primary generalized (osteo)arthritis: Secondary | ICD-10-CM | POA: Diagnosis not present

## 2018-11-06 DIAGNOSIS — M159 Polyosteoarthritis, unspecified: Secondary | ICD-10-CM

## 2018-11-06 DIAGNOSIS — F1320 Sedative, hypnotic or anxiolytic dependence, uncomplicated: Secondary | ICD-10-CM

## 2018-11-06 DIAGNOSIS — F419 Anxiety disorder, unspecified: Secondary | ICD-10-CM

## 2018-11-06 MED ORDER — ALPRAZOLAM 1 MG PO TABS: 1.0000 mg | ORAL_TABLET | Freq: Three times a day (TID) | ORAL | 5 refills | Status: DC

## 2018-11-06 NOTE — Progress Notes (Signed)
Virtual Visit via telephone Note  Attempted to call patient 3:56 pm, no answer. VM left   I connected with Joyce [name] on 11/06/18 at 4:11 pm by telephone and verified that I am speaking with the correct person using two identifiers. Joyce [name] is currently located at home and no one is currently with her during visit. The provider, Evelina Dun, FNP is located in their office at time of visit.  I discussed the limitations, risks, security and privacy concerns of performing an evaluation and management service by telephone and the availability of in person appointments. I also discussed with the patient that there may be a patient responsible charge related to this service. The patient expressed understanding and agreed to proceed.   History and Present Illness:  PT presents to the office today for chronic follow up. She has psoriasis and has seen dermatologists.  Anxiety Presents for follow-up visit. Symptoms include decreased concentration, depressed mood, excessive worry, irritability, nervous/anxious behavior and restlessness. Symptoms occur most days. The severity of symptoms is moderate. The quality of sleep is good.    Hypertension This is a chronic problem. The current episode started more than 1 year ago. The problem has been resolved (had nurse checked at work and was told it was "fine") since onset. The problem is controlled. Associated symptoms include anxiety. Risk factors for coronary artery disease include dyslipidemia, obesity and sedentary lifestyle. The current treatment provides moderate improvement. There is no history of kidney disease, CAD/MI or heart failure.  Hyperlipidemia This is a chronic problem. The current episode started more than 1 year ago. The problem is controlled. Recent lipid tests were reviewed and are normal. Current antihyperlipidemic treatment includes ezetimibe. The current treatment provides moderate improvement of lipids. Risk factors for  coronary artery disease include dyslipidemia, hypertension and a sedentary lifestyle.  Arthritis Presents for follow-up visit. She complains of pain and stiffness. The symptoms have been stable. Affected locations include the right knee, left knee, right MCP, left MCP, right elbow and left elbow. Her pain is at a severity of 9/10.      Review of Systems  Constitutional: Positive for irritability.  Musculoskeletal: Positive for arthritis and stiffness.  Psychiatric/Behavioral: Positive for decreased concentration. The patient is nervous/anxious.   All other systems reviewed and are negative.    Observations/Objective: No SOB or distress  Assessment and Plan: [name] comes in today with chief complaint of No chief complaint on file.   Diagnosis and orders addressed:  1. Essential hypertension  2. Psoriasis - Ambulatory referral to Dermatology  3. Primary osteoarthritis involving multiple joints  4. Obesity (BMI 30-39.9)  5. Hypercholesteremia  6. Controlled substance agreement signed - ALPRAZolam (XANAX) 1 MG tablet; Take 1 tablet (1 mg total) by mouth 3 (three) times daily.  Dispense: 90 tablet; Refill: 5  7. Benzodiazepine dependence (HCC) - ALPRAZolam (XANAX) 1 MG tablet; Take 1 tablet (1 mg total) by mouth 3 (three) times daily.  Dispense: 90 tablet; Refill: 5  8. Anxiety  - ALPRAZolam (XANAX) 1 MG tablet; Take 1 tablet (1 mg total) by mouth 3 (three) times daily.  Dispense: 90 tablet; Refill: 5   Labs pending [place] controlled database reviewed- No red flags noted Health Maintenance reviewed Diet and exercise encouraged  Follow up plan: 6 months      I discussed the assessment and treatment plan with the patient. The patient was provided an opportunity to ask questions and all were answered. The patient agreed with  the plan and demonstrated an understanding of the instructions.   The patient was advised to call back or seek an in-person evaluation if  the symptoms worsen or if the condition fails to improve as anticipated.  The above assessment and management plan was discussed with the patient. The patient verbalized understanding of and has agreed to the management plan. Patient is aware to call the clinic if symptoms persist or worsen. Patient is aware when to return to the clinic for a follow-up visit. Patient educated on when it is appropriate to go to the emergency department.   Time call ended: 4:26 pm   I provided 15 minutes of non-face-to-face time during this encounter.    Evelina Dun, FNP

## 2018-11-23 IMAGING — DX DG ANKLE COMPLETE 3+V*R*
3 series · 3 of 3 positions shown · non-contrast
Comparison: None.

CLINICAL DATA: Twisting injury 2 weeks ago with persistent pain,
initial encounter

EXAM:
RIGHT ANKLE - COMPLETE 3+ VIEW

[ankle ap]
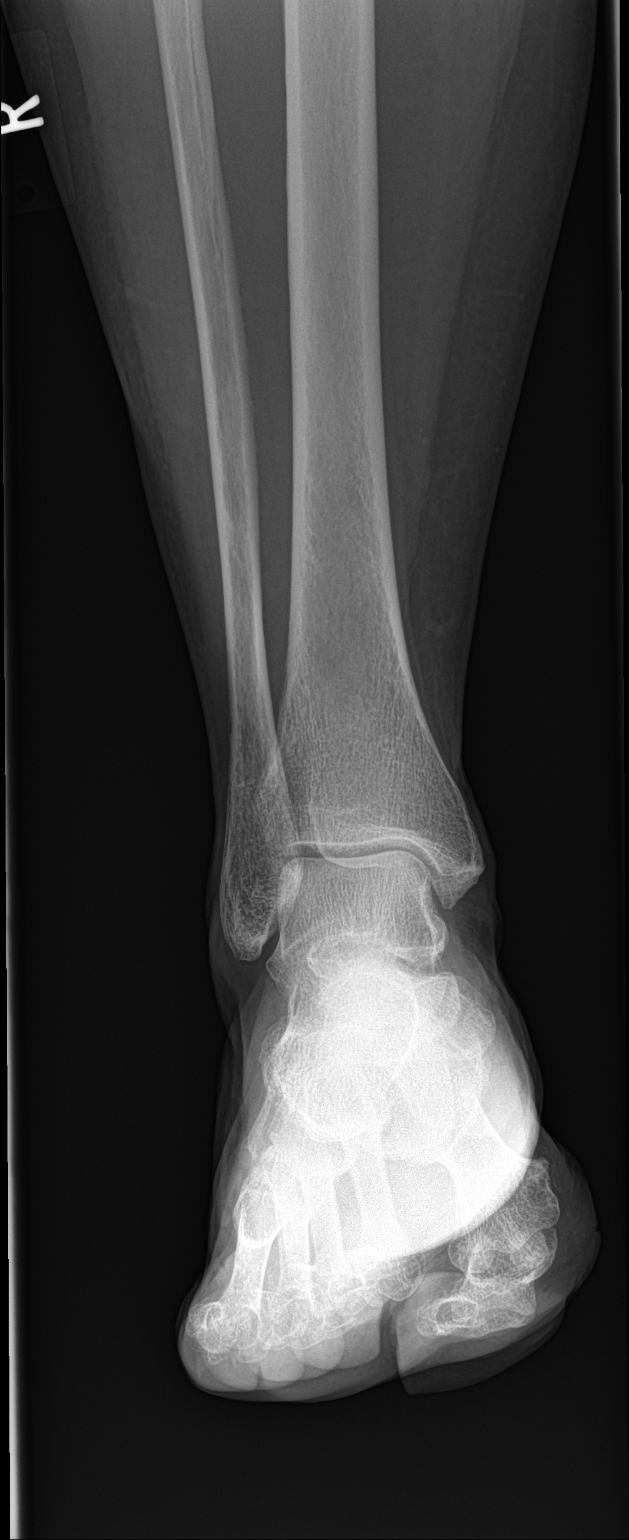

[ankle obl]
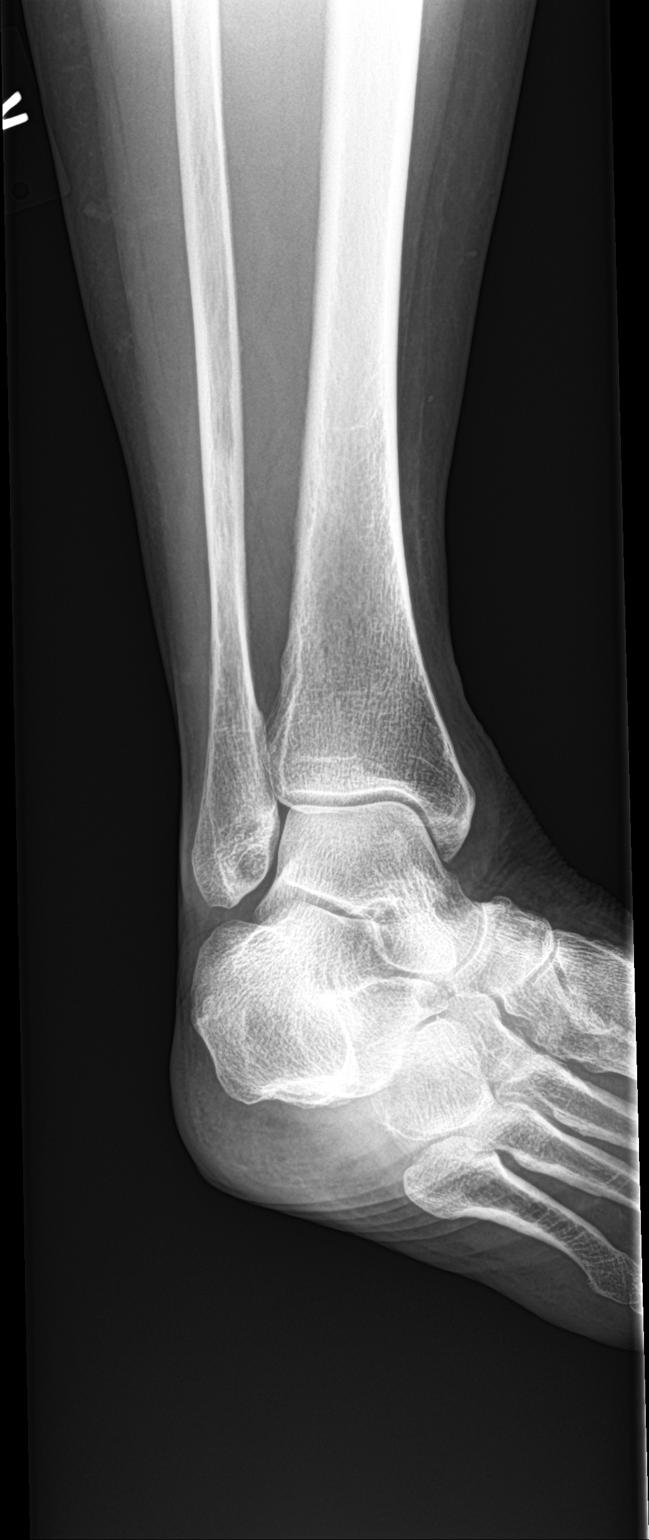

[ankle lat]
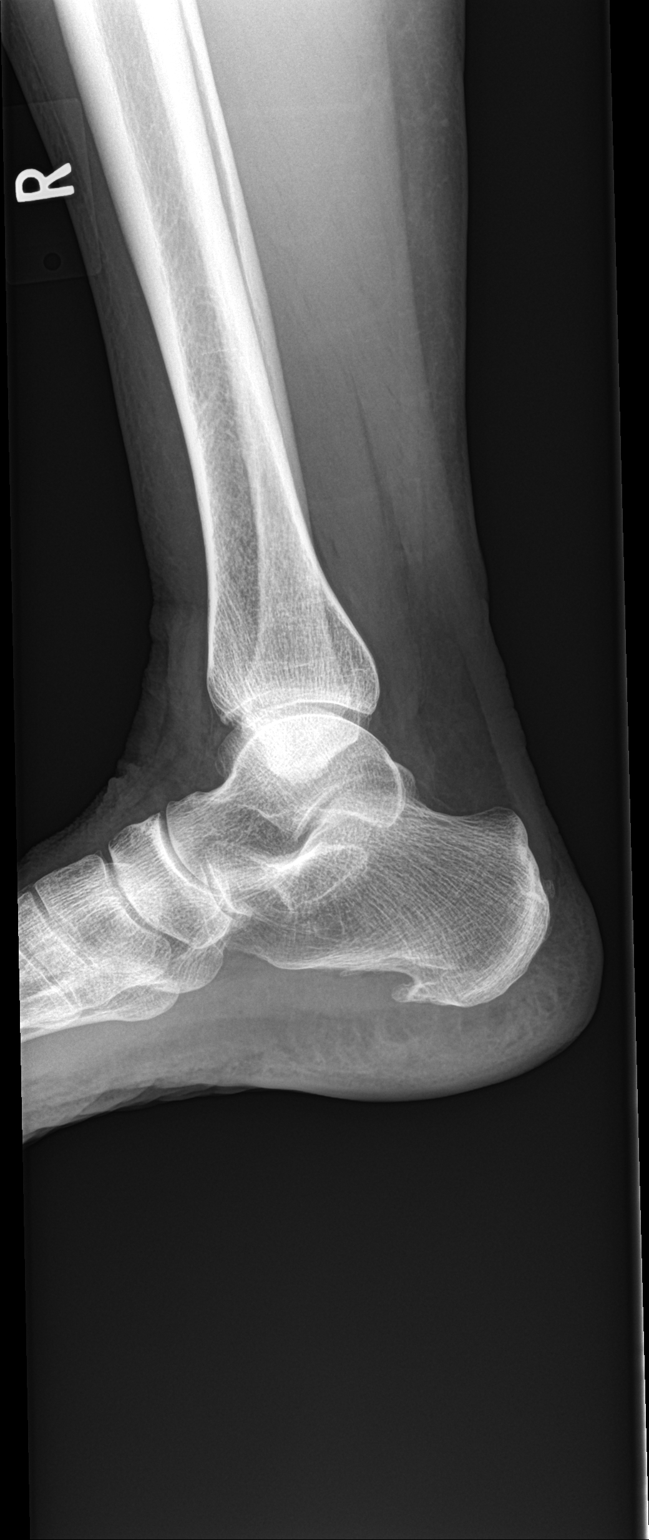

[3 of 3 positions shown; findings below may reference images not displayed]

FINDINGS: No acute fracture or dislocation is noted. Mild calcaneal spurring
is noted. No soft tissue changes are seen.
IMPRESSION: Mild degenerative change without acute abnormality

## 2018-12-17 ENCOUNTER — Other Ambulatory Visit (HOSPITAL_COMMUNITY): Payer: Self-pay | Admitting: Adult Health

## 2018-12-17 DIAGNOSIS — Z1231 Encounter for screening mammogram for malignant neoplasm of breast: Secondary | ICD-10-CM

## 2019-01-28 ENCOUNTER — Other Ambulatory Visit: Payer: Self-pay

## 2019-01-28 ENCOUNTER — Ambulatory Visit (HOSPITAL_COMMUNITY)
Admission: RE | Admit: 2019-01-28 | Discharge: 2019-01-28 | Disposition: A | Discharge status: Home / Self Care | Payer: Medicare Other | Source: Ambulatory Visit | Attending: Adult Health | Admitting: Adult Health

## 2019-01-28 DIAGNOSIS — Z1231 Encounter for screening mammogram for malignant neoplasm of breast: Secondary | ICD-10-CM | POA: Insufficient documentation

## 2019-01-30 DIAGNOSIS — L400 Psoriasis vulgaris: Secondary | ICD-10-CM | POA: Diagnosis not present

## 2019-01-30 DIAGNOSIS — Z23 Encounter for immunization: Secondary | ICD-10-CM | POA: Diagnosis not present

## 2019-01-31 ENCOUNTER — Other Ambulatory Visit: Payer: Medicare Other

## 2019-02-13 ENCOUNTER — Observation Stay (HOSPITAL_COMMUNITY)
Admission: EM | Admit: 2019-02-13 | Discharge: 2019-02-14 | Disposition: A | Discharge status: Home / Self Care | Payer: Medicare Other | Attending: Family Medicine | Admitting: Family Medicine

## 2019-02-13 ENCOUNTER — Other Ambulatory Visit: Payer: Self-pay

## 2019-02-13 ENCOUNTER — Telehealth: Payer: Self-pay | Admitting: Family

## 2019-02-13 ENCOUNTER — Observation Stay (HOSPITAL_COMMUNITY): Payer: Medicare Other

## 2019-02-13 ENCOUNTER — Observation Stay (HOSPITAL_BASED_OUTPATIENT_CLINIC_OR_DEPARTMENT_OTHER): Payer: Medicare Other

## 2019-02-13 ENCOUNTER — Emergency Department (HOSPITAL_COMMUNITY): Payer: Medicare Other

## 2019-02-13 ENCOUNTER — Encounter (HOSPITAL_COMMUNITY): Payer: Self-pay

## 2019-02-13 DIAGNOSIS — I340 Nonrheumatic mitral (valve) insufficiency: Secondary | ICD-10-CM

## 2019-02-13 DIAGNOSIS — Z859 Personal history of malignant neoplasm, unspecified: Secondary | ICD-10-CM | POA: Insufficient documentation

## 2019-02-13 DIAGNOSIS — Z79899 Other long term (current) drug therapy: Secondary | ICD-10-CM | POA: Diagnosis not present

## 2019-02-13 DIAGNOSIS — I361 Nonrheumatic tricuspid (valve) insufficiency: Secondary | ICD-10-CM

## 2019-02-13 DIAGNOSIS — R29818 Other symptoms and signs involving the nervous system: Secondary | ICD-10-CM | POA: Diagnosis not present

## 2019-02-13 DIAGNOSIS — Z20828 Contact with and (suspected) exposure to other viral communicable diseases: Secondary | ICD-10-CM | POA: Insufficient documentation

## 2019-02-13 DIAGNOSIS — I6523 Occlusion and stenosis of bilateral carotid arteries: Secondary | ICD-10-CM | POA: Diagnosis not present

## 2019-02-13 DIAGNOSIS — G459 Transient cerebral ischemic attack, unspecified: Principal | ICD-10-CM | POA: Insufficient documentation

## 2019-02-13 DIAGNOSIS — F419 Anxiety disorder, unspecified: Secondary | ICD-10-CM | POA: Diagnosis present

## 2019-02-13 DIAGNOSIS — Z87891 Personal history of nicotine dependence: Secondary | ICD-10-CM | POA: Diagnosis not present

## 2019-02-13 DIAGNOSIS — R55 Syncope and collapse: Secondary | ICD-10-CM | POA: Diagnosis not present

## 2019-02-13 DIAGNOSIS — I10 Essential (primary) hypertension: Secondary | ICD-10-CM | POA: Diagnosis not present

## 2019-02-13 DIAGNOSIS — E782 Mixed hyperlipidemia: Secondary | ICD-10-CM | POA: Diagnosis not present

## 2019-02-13 DIAGNOSIS — Z03818 Encounter for observation for suspected exposure to other biological agents ruled out: Secondary | ICD-10-CM | POA: Diagnosis not present

## 2019-02-13 DIAGNOSIS — E7800 Pure hypercholesterolemia, unspecified: Secondary | ICD-10-CM | POA: Diagnosis present

## 2019-02-13 DIAGNOSIS — R200 Anesthesia of skin: Secondary | ICD-10-CM | POA: Diagnosis not present

## 2019-02-13 LAB — URINALYSIS, ROUTINE W REFLEX MICROSCOPIC
Bacteria, UA: NONE SEEN
Bilirubin Urine: NEGATIVE
Glucose, UA: >=500 mg/dL — AB
Hgb urine dipstick: NEGATIVE
Ketones, ur: NEGATIVE mg/dL
Leukocytes,Ua: NEGATIVE
Nitrite: NEGATIVE
Protein, ur: NEGATIVE mg/dL
Specific Gravity, Urine: 1.017 (ref 1.005–1.030)
pH: 5 (ref 5.0–8.0)

## 2019-02-13 LAB — I-STAT CHEM 8, ED
BUN: 20 mg/dL (ref 8–23)
Calcium, Ion: 1.23 mmol/L (ref 1.15–1.40)
Chloride: 105 mmol/L (ref 98–111)
Creatinine, Ser: 0.7 mg/dL (ref 0.44–1.00)
Glucose, Bld: 101 mg/dL — ABNORMAL HIGH (ref 70–99)
HCT: 45 % (ref 36.0–46.0)
Hemoglobin: 15.3 g/dL — ABNORMAL HIGH (ref 12.0–15.0)
Potassium: 4.3 mmol/L (ref 3.5–5.1)
Sodium: 140 mmol/L (ref 135–145)
TCO2: 23 mmol/L (ref 22–32)

## 2019-02-13 LAB — ETHANOL: Alcohol, Ethyl (B): <10 mg/dL (ref <10)

## 2019-02-13 LAB — CBG MONITORING, ED: Glucose-Capillary: 94 mg/dL (ref 70–99)

## 2019-02-13 LAB — RAPID URINE DRUG SCREEN, HOSP PERFORMED
Amphetamines: NOT DETECTED
Barbiturates: NOT DETECTED
Benzodiazepines: POSITIVE — AB
Cocaine: NOT DETECTED
Opiates: NOT DETECTED
Tetrahydrocannabinol: NOT DETECTED

## 2019-02-13 LAB — COMPREHENSIVE METABOLIC PANEL
ALT: 30 U/L (ref 0–44)
AST: 22 U/L (ref 15–41)
Albumin: 4.5 g/dL (ref 3.5–5.0)
Alkaline Phosphatase: 55 U/L (ref 38–126)
Anion gap: 10 (ref 5–15)
BUN: 20 mg/dL (ref 8–23)
CO2: 23 mmol/L (ref 22–32)
Calcium: 9.4 mg/dL (ref 8.9–10.3)
Chloride: 106 mmol/L (ref 98–111)
Creatinine, Ser: 0.7 mg/dL (ref 0.44–1.00)
GFR calc Af Amer: >60 mL/min (ref >60)
GFR calc non Af Amer: >60 mL/min (ref >60)
Glucose, Bld: 102 mg/dL — ABNORMAL HIGH (ref 70–99)
Potassium: 4.3 mmol/L (ref 3.5–5.1)
Sodium: 139 mmol/L (ref 135–145)
Total Bilirubin: 1.1 mg/dL (ref 0.3–1.2)
Total Protein: 7.4 g/dL (ref 6.5–8.1)

## 2019-02-13 LAB — PROTIME-INR
INR: 1 (ref 0.8–1.2)
Prothrombin Time: 13.5 seconds (ref 11.4–15.2)

## 2019-02-13 LAB — APTT: aPTT: 29 seconds (ref 24–36)

## 2019-02-13 LAB — DIFFERENTIAL
Abs Immature Granulocytes: 0.08 10*3/uL — ABNORMAL HIGH (ref 0.00–0.07)
Basophils Absolute: 0 10*3/uL (ref 0.0–0.1)
Basophils Relative: 0 %
Eosinophils Absolute: 0 10*3/uL (ref 0.0–0.5)
Eosinophils Relative: 0 %
Immature Granulocytes: 1 %
Lymphocytes Relative: 17 %
Lymphs Abs: 2.1 10*3/uL (ref 0.7–4.0)
Monocytes Absolute: 0.6 10*3/uL (ref 0.1–1.0)
Monocytes Relative: 5 %
Neutro Abs: 9.6 10*3/uL — ABNORMAL HIGH (ref 1.7–7.7)
Neutrophils Relative %: 77 %

## 2019-02-13 LAB — CBC
HCT: 47.3 % — ABNORMAL HIGH (ref 36.0–46.0)
Hemoglobin: 15.1 g/dL — ABNORMAL HIGH (ref 12.0–15.0)
MCH: 29.9 pg (ref 26.0–34.0)
MCHC: 31.9 g/dL (ref 30.0–36.0)
MCV: 93.7 fL (ref 80.0–100.0)
Platelets: 293 10*3/uL (ref 150–400)
RBC: 5.05 MIL/uL (ref 3.87–5.11)
RDW: 12.9 % (ref 11.5–15.5)
WBC: 12.3 10*3/uL — ABNORMAL HIGH (ref 4.0–10.5)
nRBC: 0 % (ref 0.0–0.2)

## 2019-02-13 LAB — SARS CORONAVIRUS 2 (TAT 6-24 HRS): SARS Coronavirus 2: NEGATIVE

## 2019-02-13 LAB — TROPONIN I (HIGH SENSITIVITY)
Troponin I (High Sensitivity): 3 ng/L (ref <18)
Troponin I (High Sensitivity): 3 ng/L (ref <18)

## 2019-02-13 LAB — ECHOCARDIOGRAM COMPLETE

## 2019-02-13 MED ORDER — DIPHENHYDRAMINE HCL 25 MG PO CAPS: 25.0000 mg | ORAL_CAPSULE | Freq: Every day | ORAL | Status: DC | PRN

## 2019-02-13 MED ORDER — FOLIC ACID 1 MG PO TABS
1.0000 mg | ORAL_TABLET | Freq: Every day | ORAL | Status: DC
  Administered 2019-02-14: 11:00:00 1 mg via ORAL
  Filled 2019-02-13: qty 1

## 2019-02-13 MED ORDER — ASPIRIN 81 MG PO CHEW
81.0000 mg | CHEWABLE_TABLET | Freq: Every day | ORAL | Status: DC
  Administered 2019-02-14: 81 mg via ORAL
  Filled 2019-02-13: qty 1

## 2019-02-13 MED ORDER — LISINOPRIL 10 MG PO TABS: 20.0000 mg | ORAL_TABLET | Freq: Every day | ORAL | Status: DC

## 2019-02-13 MED ORDER — SODIUM CHLORIDE 0.9 % IV BOLUS
1000.0000 mL | Freq: Once | INTRAVENOUS | Status: AC
  Administered 2019-02-13: 1000 mL via INTRAVENOUS

## 2019-02-13 MED ORDER — ESCITALOPRAM OXALATE 10 MG PO TABS
20.0000 mg | ORAL_TABLET | Freq: Every day | ORAL | Status: DC
  Administered 2019-02-14: 20 mg via ORAL
  Filled 2019-02-13: qty 2

## 2019-02-13 MED ORDER — CELECOXIB 100 MG PO CAPS
ORAL_CAPSULE | ORAL | Status: AC
  Filled 2019-02-13: qty 2

## 2019-02-13 MED ORDER — SODIUM CHLORIDE 0.9 % IV SOLN: 250.0000 mL | INTRAVENOUS | Status: DC | PRN

## 2019-02-13 MED ORDER — ONDANSETRON HCL 4 MG PO TABS: 4.0000 mg | ORAL_TABLET | Freq: Four times a day (QID) | ORAL | Status: DC | PRN

## 2019-02-13 MED ORDER — HEPARIN SODIUM (PORCINE) 5000 UNIT/ML IJ SOLN
5000.0000 [IU] | Freq: Three times a day (TID) | INTRAMUSCULAR | Status: DC
  Administered 2019-02-13 – 2019-02-14 (×3): 5000 [IU] via SUBCUTANEOUS
  Filled 2019-02-13 (×2): qty 1

## 2019-02-13 MED ORDER — SODIUM CHLORIDE 0.9% FLUSH
3.0000 mL | Freq: Two times a day (BID) | INTRAVENOUS | Status: DC
  Administered 2019-02-13 – 2019-02-14 (×2): 3 mL via INTRAVENOUS

## 2019-02-13 MED ORDER — CELECOXIB 100 MG PO CAPS
200.0000 mg | ORAL_CAPSULE | Freq: Every evening | ORAL | Status: DC
  Administered 2019-02-13 – 2019-02-14 (×2): 200 mg via ORAL
  Filled 2019-02-13 (×2): qty 1
  Filled 2019-02-13: qty 2

## 2019-02-13 MED ORDER — ONDANSETRON HCL 4 MG/2ML IJ SOLN: 4.0000 mg | Freq: Four times a day (QID) | INTRAMUSCULAR | Status: DC | PRN

## 2019-02-13 MED ORDER — ALBUTEROL SULFATE (2.5 MG/3ML) 0.083% IN NEBU: 2.5000 mg | INHALATION_SOLUTION | RESPIRATORY_TRACT | Status: DC | PRN

## 2019-02-13 MED ORDER — ALPRAZOLAM 0.5 MG PO TABS
1.0000 mg | ORAL_TABLET | Freq: Three times a day (TID) | ORAL | Status: DC
  Administered 2019-02-13 – 2019-02-14 (×3): 1 mg via ORAL
  Filled 2019-02-13 (×3): qty 2

## 2019-02-13 MED ORDER — TRAZODONE HCL 50 MG PO TABS: 50.0000 mg | ORAL_TABLET | Freq: Every evening | ORAL | Status: DC | PRN

## 2019-02-13 MED ORDER — ASPIRIN 325 MG PO TABS
325.0000 mg | ORAL_TABLET | Freq: Once | ORAL | Status: AC
  Administered 2019-02-13: 325 mg via ORAL
  Filled 2019-02-13: qty 1

## 2019-02-13 MED ORDER — ACETAMINOPHEN 650 MG RE SUPP: 650.0000 mg | Freq: Four times a day (QID) | RECTAL | Status: DC | PRN

## 2019-02-13 MED ORDER — POLYETHYLENE GLYCOL 3350 17 G PO PACK: 17.0000 g | PACK | Freq: Every day | ORAL | Status: DC | PRN

## 2019-02-13 MED ORDER — SODIUM CHLORIDE 0.9% FLUSH
3.0000 mL | INTRAVENOUS | Status: DC | PRN
  Administered 2019-02-13: 3 mL via INTRAVENOUS
  Filled 2019-02-13: qty 3

## 2019-02-13 MED ORDER — ROSUVASTATIN CALCIUM 20 MG PO TABS
20.0000 mg | ORAL_TABLET | Freq: Every evening | ORAL | Status: DC
  Administered 2019-02-13 – 2019-02-14 (×2): 20 mg via ORAL
  Filled 2019-02-13 (×2): qty 1

## 2019-02-13 MED ORDER — EZETIMIBE 10 MG PO TABS
10.0000 mg | ORAL_TABLET | Freq: Every day | ORAL | Status: DC
  Administered 2019-02-14: 10 mg via ORAL
  Filled 2019-02-13: qty 1

## 2019-02-13 MED ORDER — ACETAMINOPHEN 325 MG PO TABS: 650.0000 mg | ORAL_TABLET | Freq: Four times a day (QID) | ORAL | Status: DC | PRN

## 2019-02-13 NOTE — ED Notes (Signed)
Dr. Gilford Raid notified of possible Code Stroke in triage. EDP coming to evaluate now.

## 2019-02-13 NOTE — ED Provider Notes (Signed)
[facility] EMERGENCY DEPARTMENT Provider Note   CSN: [id number] Arrival date & time: 02/13/19  1244  An emergency department physician performed an initial assessment on this suspected stroke patient at 1258.  History   Chief Complaint Chief Complaint  Patient presents with  . Code Stroke    HPI Joyce [name] is a 63 y.o. female.     Pt presents to the ED today with a possible stroke.  Pt said she nearly passed out at work about 1 hr pta.  When she came back around, she felt numbness to the left side of her face and to the left hand.  She felt like she is drooling.  She was able to walk and denies any muscle weakness.  LKN 1150.  Pt denies any f/c.  No known covid contacts.  She denies any cp or sob.       Past Medical History:  Diagnosis Date  . Anxiety   . Arthritis   . Cancer ([place])   . Headache(784.0)   . Hypercholesteremia     Patient Active Problem List   Diagnosis Date Noted  . TIA (transient ischemic attack) 02/13/2019  . Osteoarthritis 02/01/2018  . Obesity (BMI 30-39.9) 02/01/2018  . Controlled substance agreement signed 02/01/2018  . Benzodiazepine dependence ([place]) 02/01/2018  . HTN (hypertension) 10/04/2016  . Psoriasis 10/04/2016  . Rectal bleeding 08/03/2016  . Anal or rectal pain 08/03/2016  . Hypercholesteremia 12/12/2012  . Anxiety 12/12/2012    Past Surgical History:  Procedure Laterality Date  . ABDOMINAL HYSTERECTOMY    . COLONOSCOPY  04/05/2012   Procedure: COLONOSCOPY;  Surgeon: Rogene Houston, MD;  Location: [facility] ENDO SU[facility];  Service: Endoscopy;  Laterality: N/A;  930  . COLONOSCOPY N/A 08/17/2016   Procedure: COLONOSCOPY;  Surgeon: Danie Binder, MD;  Location: [facility] ENDO SUITE;  Service: Endoscopy;  Laterality: N/A;  10:00am  . HEMORRHOID BANDING N/A 08/17/2016   Procedure: HEMORRHOID BANDING;  Surgeon: Danie Binder, MD;  Location: [facility] ENDO SUITE;  Service: Endoscopy;  Laterality: N/A;  . INCISION AND DRAINAGE Right 08/01/2015   Procedure: INCISION AND DRAINAGE RIGHT RING FINGER;  Surgeon: Carole Civil, MD;  Location: [facility] ORS;  Service: Orthopedics;  Laterality: Right;     OB History    Gravida  1   Para  1   Term  1   Preterm      AB      Living  1     SAB      TAB      Ectopic      Multiple      Live Births  1            Home Medications    Prior to Admission medications   Medication Sig Start Date End Date Taking? Authorizing Provider  acetaminophen (TYLENOL) 500 MG tablet Take 1,000 mg by mouth daily as needed for headache.   Yes [provider]  ALPRAZolam Duanne Moron) 1 MG tablet Take 1 tablet (1 mg total) by mouth 3 (three) times daily. 11/06/18  Yes Hawks, Christy A, FNP  augmented betamethasone dipropionate (DIPROLENE-AF) 0.05 % ointment Apply 1 application topically 2 (two) times daily.  08/03/16  Yes [provider]  betamethasone dipropionate 0.05 % cream Apply  a small amount to affected area twice a day as needed APPLY TWICE A DAY AND THEN AS NEEDED FOR FLARE/ITCHING 01/30/19  Yes [provider]  celecoxib (CELEBREX) 200 MG capsule Take 1 capsule (200  mg total) by mouth every evening. 06/04/18  Yes Hawks, Christy A, FNP  diphenhydrAMINE (BENADRYL) 25 mg capsule Take 1 capsule (25 mg total) by mouth 3 (three) times daily. Patient taking differently: Take 25 mg by mouth daily as needed for itching.  08/17/15  Yes Carole Civil, MD  escitalopram (LEXAPRO) 20 MG tablet Take 1 tablet (20 mg total) by mouth daily. 06/04/18  Yes Hawks, Christy A, FNP  ezetimibe (ZETIA) 10 MG tablet Take 1 tablet (10 mg total) by mouth daily. 06/04/18  Yes Hawks, Christy A, FNP  folic acid (FOLVITE) 1 MG tablet Take 1 mg by mouth as directed. 02/12/19  Yes [provider]  lisinopril (PRINIVIL,ZESTRIL) 20 MG tablet Take 1 tablet (20 mg total) by mouth daily. 06/04/18  Yes Hawks, Christy A, FNP  methotrexate (RHEUMATREX) 2.5 MG tablet Take 3 tablets by mouth once a week.  02/12/19  Yes [provider]  rosuvastatin (CRESTOR) 20 MG tablet Take 1 tablet (20 mg total) by mouth every evening. 06/04/18  Yes Hawks, Theador Hawthorne, FNP    Family History Family History  Problem Relation Age of Onset  . Ovarian cancer Mother   . Bladder Cancer Father   . Lung cancer Sister   . Cancer Brother        liver  . Cancer Other   . Colon cancer Neg Hx     Social History Social History   Tobacco Use  . Smoking status: Former Smoker    Packs/day: 1.00    Years: 44.00    Pack years: 44.00    Types: Cigarettes  . Smokeless tobacco: Never Used  Substance Use Topics  . Alcohol use: No    Alcohol/week: 0.0 standard drinks  . Drug use: No     Allergies   Other   Review of Systems Review of Systems  Neurological: Positive for numbness.  All other systems reviewed and are negative.    Physical Exam Updated Vital Signs BP (!) 122/93   Pulse 82   Resp 16   SpO2 99%   Physical Exam Vitals signs and nursing note reviewed.  Constitutional:      Appearance: Normal appearance.  HENT:     Head: Normocephalic and atraumatic.     Right Ear: External ear normal.     Left Ear: External ear normal.     Nose: Nose normal.     Mouth/Throat:     Mouth: Mucous membranes are moist.     Pharynx: Oropharynx is clear.  Eyes:     Extraocular Movements: Extraocular movements intact.     Conjunctiva/sclera: Conjunctivae normal.     Pupils: Pupils are equal, round, and reactive to light.  Neck:     Musculoskeletal: Normal range of motion.  Cardiovascular:     Rate and Rhythm: Normal rate and regular rhythm.     Pulses: Normal pulses.     Heart sounds: Normal heart sounds.  Pulmonary:     Effort: Pulmonary effort is normal.     Breath sounds: Normal breath sounds.  Abdominal:     General: Abdomen is flat. Bowel sounds are normal.     Palpations: Abdomen is soft.  Musculoskeletal: Normal range of motion.  Skin:    General: Skin is warm.     Capillary  Refill: Capillary refill takes less than 2 seconds.  Neurological:     Mental Status: She is alert and oriented to person, place, and time.     Comments: Numbness to left  face and to left hand  Psychiatric:        Mood and Affect: Mood normal.        Behavior: Behavior normal.        Thought Content: Thought content normal.        Judgment: Judgment normal.      ED Treatments / Results  Labs (all labs ordered are listed, but only abnormal results are displayed) Labs Reviewed  CBC - Abnormal; Notable for the following components:      Result Value   WBC 12.3 (*)    Hemoglobin 15.1 (*)    HCT 47.3 (*)    All other components within normal limits  DIFFERENTIAL - Abnormal; Notable for the following components:   Neutro Abs 9.6 (*)    Abs Immature Granulocytes 0.08 (*)    All other components within normal limits  COMPREHENSIVE METABOLIC PANEL - Abnormal; Notable for the following components:   Glucose, Bld 102 (*)    All other components within normal limits  RAPID URINE DRUG SCREEN, HOSP PERFORMED - Abnormal; Notable for the following components:   Benzodiazepines POSITIVE (*)    All other components within normal limits  URINALYSIS, ROUTINE W REFLEX MICROSCOPIC - Abnormal; Notable for the following components:   APPearance HAZY (*)    Glucose, UA >=500 (*)    All other components within normal limits  I-STAT CHEM 8, ED - Abnormal; Notable for the following components:   Glucose, Bld 101 (*)    Hemoglobin 15.3 (*)    All other components within normal limits  SARS CORONAVIRUS 2 (TAT 6-24 HRS)  ETHANOL  PROTIME-INR  APTT  CBG MONITORING, ED  TROPONIN I (HIGH SENSITIVITY)    EKG EKG Interpretation  Date/Time:  Wednesday February 13 2019 13:11:28 EDT Ventricular Rate:  64 PR Interval:    QRS Duration: 92 QT Interval:  390 QTC Calculation: 403 R Axis:   59 Text Interpretation:  Sinus rhythm Low voltage, precordial leads Baseline wander in lead(s) I Confirmed by  Isla Pence [phone]) on 02/13/2019 1:17:44 PM   Radiology Ct Head Code Stroke Wo Contrast  Result Date: 02/13/2019 CLINICAL DATA:  Code stroke. Numbness or tingling. Paresthesia. Left facial numbness. EXAM: CT HEAD WITHOUT CONTRAST TECHNIQUE: Contiguous axial images were obtained from the base of the skull through the vertex without intravenous contrast. COMPARISON:  None. FINDINGS: Brain: No evidence of acute infarction, hemorrhage, hydrocephalus, extra-axial collection or mass lesion/mass effect. Patchy white matter hypodensity bilaterally is symmetric and most likely due to chronic microvascular ischemia. Vascular: Negative for hyperdense vessel Skull: Negative Sinuses/Orbits: Negative Other: None ASPECTS ([place] Stroke Program Early CT Score) - Ganglionic level infarction (caudate, lentiform nuclei, internal capsule, insula, M1-M3 cortex): 7 - Supraganglionic infarction (M4-M6 cortex): 3 Total score (0-10 with 10 being normal): 10 IMPRESSION: 1. No acute intracranial abnormality 2. ASPECTS is 10 3. Patchy white matter disease bilaterally most likely chronic microvascular ischemia. 4. These results were called by telephone at the time of interpretation on 02/13/2019 at 1:21 pm to provider Deano Tomaszewski , who verbally acknowledged these results. Electronically Signed   By: Franchot Gallo M.D.   On: 02/13/2019 13:22    Procedures Procedures (including critical care time)  Medications Ordered in ED Medications  aspirin chewable tablet 81 mg (has no administration in time range)  sodium chloride 0.9 % bolus 1,000 mL (0 mLs Intravenous Stopped 02/13/19 1431)  aspirin tablet 325 mg (325 mg Oral Given 02/13/19 1352)     Initial  Impression / Assessment and Plan / ED Course  I have reviewed the triage vital signs and the nursing notes.  Pertinent labs & imaging results that were available during my care of the patient were reviewed by me and considered in my medical decision making (see chart  for details).    Code stroke called.  Teleneurology saw her and talked about tpa.  Due to the mildness of sx, this was not recommended.  Dr. Gaye Pollack (teleneurology) recommended admission for stroke and syncope eval.     Pt given an asa in ED.  Pt d/w Dr. Denton Brick (triad) for admission.  Final Clinical Impressions(s) / ED Diagnoses   Final diagnoses:  Near syncope    ED Discharge Orders    None       Isla Pence, MD 02/13/19 1436

## 2019-02-13 NOTE — Progress Notes (Signed)
1253 call time 1258 beeper time 1257 exam started 1258 exam finished 1258 images sent to [facility] 1300 exam completed in [place] radiology called Dr Gilford Raid [phone]/

## 2019-02-13 NOTE — Consult Note (Signed)
[facility] Tele[facility]urology Consult Services   Date of Service:   02/13/2019 13:08:38  Impression:     .  Rule Out Acute Ischemic Stroke     .  Numbness     .  Presycope  Comments/Sign-Out: 63 y/o woman with h/o anxiety and hyperlipidemia who presents to the ED with a near syncopal episode. NIHSS 1 for left sided numbness.  Metrics: Last Known Well: 02/13/2019 11:50:00 [facility] Notification Time: 02/13/2019 13:08:38 Arrival Time: 02/13/2019 12:59:00 Stamp Time: 02/13/2019 13:08:38 Time First Login Attempt: 02/13/2019 13:13:24 Video Start Time: 02/13/2019 13:13:24  Symptoms: "I almost passed out" NIHSS Start Assessment Time: 02/13/2019 13:13:39 Patient is not a candidate for Alteplase/Activase. Patient was not deemed candidate for Alteplase/Activase thrombolytics because of declined by patient. Video End Time: 02/13/2019 13:25:08  CT head was reviewed.  Clinical Presentation is not Suggestive of Large Vessel Occlusive Disease  ED Physician notified of diagnostic impression and management plan on 02/13/2019 13:24:05  Our recommendations are outlined below.  Recommendations:     .  Activate Stroke Protocol Admission/Order Set     .  Stroke/Telemetry Floor     .  Neuro Checks     .  Bedside Swallow Eval     .  DVT Prophylaxis     .  IV Fluids, Normal Saline     .  Head of Bed 30 Degrees     .  Euglycemia and Avoid Hyperthermia (PRN Acetaminophen)     .  ASA if no contraindication     .  Covid testing   Sign Out:     .  Discussed with Emergency Department Provider    ------------------------------------------------------------------------------  History of Present Illness: Patient is a 63 year old Female.  Patient was brought by private transportation with symptoms of "I almost passed out"  63 y/o woman with h/o anxiety and hyperlipidemia who presents to the ED with a near syncopal episode. Emergent telestroke consult requested for left  sided numbness. Patient said she was feeling hot before she nearly passed out "for a few seconds." CT head reviewed and case discussed with ED attending at bedside. 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 Patient stands up independently and ambulates unassisted and without difficulty. NIHSS 1 for left sided numbness. I discussed the risk and benefits of IV TPA and patient declined IV TPA since she believes her symptoms are nondebilitating.     Examination: BP(116/95), Pulse(57), Blood Glucose(94) 1A: Level of Consciousness - Alert; keenly responsive + 0 1B: Ask Month and Age - Both Questions Right + 0 1C: Blink Eyes & Squeeze Hands - Performs Both Tasks + 0 2: Test Horizontal Extraocular Movements - Normal + 0 3: Test Visual Fields - No Visual Loss + 0 4: Test Facial Palsy (Use Grimace if Obtunded) - Normal symmetry + 0 5A: Test Left Arm Motor Drift - No Drift for 10 Seconds + 0 5B: Test Right Arm Motor Drift - No Drift for 10 Seconds + 0 6A: Test Left Leg Motor Drift - No Drift for 5 Seconds + 0 6B: Test Right Leg Motor Drift - No Drift for 5 Seconds + 0 7: Test Limb Ataxia (FNF/Heel-Shin) - No Ataxia + 0 8: Test Sensation - Normal; No sensory loss + 0 9: Test Language/Aphasia - Normal; No aphasia + 0 10: Test Dysarthria - Normal + 0 11: Test Extinction/Inattention - No abnormality + 0  NIHSS Score: 0  Patient/Family was informed the Neurology Consult  would happen via TeleHealth consult by way of interactive audio and video telecommunications and consented to receiving care in this manner.   Due to the immediate potential for life-threatening deterioration due to underlying acute neurologic illness, I spent 15 minutes providing critical care. This time includes time for face to face visit via telemedicine, review of medical records, imaging studies and discussion of findings with providers, the patient and/or family.   Dr Meryl Crutch   [facility] [phone]   Case QT:[id number]

## 2019-02-13 NOTE — Progress Notes (Signed)
*  PRELIMINARY RESULTS* Echocardiogram 2d echo has been performed.  Leavy Cella 02/13/2019, 4:24 PM

## 2019-02-13 NOTE — H&P (Signed)
Patient Demographics:    Joyce [name], is a 63 y.o. female  MRN: [id number]   DOB - 1956-04-08  Admit Date - 02/13/2019  Outpatient Primary MD for the patient is Sharion Balloon, FNP   Assessment & Plan:    Principal Problem:   TIA (transient ischemic attack) Vs Syncope Active Problems:   Hypercholesteremia   Anxiety   HTN (hypertension)    1)Syncope- admit to telemetry monitored unit, watch for arrhythmias, check serial troponins and EKG for rule out ACS protocol,  echocardiogram without significant aortic stenosis or other outflow obstruction,  EF 60 to 65%, without segmental/Regional wall motion abnormalities.  carotid artery Dopplers without hemodynamically significant stenosis -CT head and MRI brain without definite stroke -EEG pending - DC to  Home after EEG-if no significant arrhythmias on telemetry   2)HTN--stable, continue lisinopril  3) arthritis--hold methotrexate, continue Celebrex  4) anxiety disorder--- continue Lexapro, use Xanax , may have Benadryl as needed  5)HLD-okay to continue Crestor and Zetia    With History of - Reviewed by me  Past Medical History:  Diagnosis Date   Anxiety    Arthritis    Cancer ([place])    Headache(784.0)    Hypercholesteremia       Past Surgical History:  Procedure Laterality Date   ABDOMINAL HYSTERECTOMY     COLONOSCOPY  04/05/2012   Procedure: COLONOSCOPY;  Surgeon: Rogene Houston, MD;  Location: [facility];  Service: Endoscopy;  Laterality: N/A;  930   COLONOSCOPY N/A 08/17/2016   Procedure: COLONOSCOPY;  Surgeon: Danie Binder, MD;  Location: [facility];  Service: Endoscopy;  Laterality: N/A;  10:00am   HEMORRHOID BANDING N/A 08/17/2016   Procedure: HEMORRHOID BANDING;  Surgeon: Danie Binder, MD;  Location: [facility];   Service: Endoscopy;  Laterality: N/A;   INCISION AND DRAINAGE Right 08/01/2015   Procedure: INCISION AND DRAINAGE RIGHT RING FINGER;  Surgeon: Carole Civil, MD;  Location: [facility];  Service: Orthopedics;  Laterality: Right;      Chief Complaint  Patient presents with   Code Stroke      HPI:    Joyce [name]  is a 63 y.o. female with past medical history relevant for anxiety disorder, arthritis, HTN and HLD who presents to the ED with concerns about near syncopal episode while at work about an hour PTA -Patient says she almost passed out while at work when she came around she has significant numbness over the left side of her face and left upper extremity  -No chest pains, no palpitations, -No tongue biting or incontinence --Patient apparently was drooling when she came around, ??  Seizures  -Patient has right, chronic vision problems  In ED--- CT head was unremarkable  _EDP consulted telemetry neurology who recommended observation and further neuro work-up    Review of systems:    In addition to the HPI above,   A full Review of  Systems  was done, all other systems reviewed are negative except as noted above in HPI , .    Social History:  Reviewed by me    Social History   Tobacco Use   Smoking status: Former Smoker    Packs/day: 1.00    Years: 44.00    Pack years: 44.00    Types: Cigarettes   Smokeless tobacco: Never Used  Substance Use Topics   Alcohol use: No    Alcohol/week: 0.0 standard drinks     Family History :  Reviewed by me    Family History  Problem Relation Age of Onset   Ovarian cancer Mother    Bladder Cancer Father    Lung cancer Sister    Cancer Brother        liver   Cancer Other    Colon cancer Neg Hx      Home Medications:   Prior to Admission medications   Medication Sig Start Date End Date Taking? Authorizing Provider  acetaminophen (TYLENOL) 500 MG tablet Take 1,000 mg by mouth daily as needed for headache.    Yes [provider]  ALPRAZolam Duanne Moron) 1 MG tablet Take 1 tablet (1 mg total) by mouth 3 (three) times daily. 11/06/18  Yes Hawks, Christy A, FNP  augmented betamethasone dipropionate (DIPROLENE-AF) 0.05 % ointment Apply 1 application topically 2 (two) times daily.  08/03/16  Yes [provider]  betamethasone dipropionate 0.05 % cream Apply  a small amount to affected area twice a day as needed APPLY TWICE A DAY AND THEN AS NEEDED FOR FLARE/ITCHING 01/30/19  Yes [provider]  celecoxib (CELEBREX) 200 MG capsule Take 1 capsule (200 mg total) by mouth every evening. 06/04/18  Yes Hawks, Christy A, FNP  diphenhydrAMINE (BENADRYL) 25 mg capsule Take 1 capsule (25 mg total) by mouth 3 (three) times daily. Patient taking differently: Take 25 mg by mouth daily as needed for itching.  08/17/15  Yes Carole Civil, MD  escitalopram (LEXAPRO) 20 MG tablet Take 1 tablet (20 mg total) by mouth daily. 06/04/18  Yes Hawks, Christy A, FNP  ezetimibe (ZETIA) 10 MG tablet Take 1 tablet (10 mg total) by mouth daily. 06/04/18  Yes Hawks, Christy A, FNP  folic acid (FOLVITE) 1 MG tablet Take 1 mg by mouth as directed. 02/12/19  Yes [provider]  lisinopril (PRINIVIL,ZESTRIL) 20 MG tablet Take 1 tablet (20 mg total) by mouth daily. 06/04/18  Yes Hawks, Christy A, FNP  methotrexate (RHEUMATREX) 2.5 MG tablet Take 3 tablets by mouth once a week. 02/12/19  Yes [provider]  rosuvastatin (CRESTOR) 20 MG tablet Take 1 tablet (20 mg total) by mouth every evening. 06/04/18  Yes Sharion Balloon, FNP     Allergies:     Allergies  Allergen Reactions   Other Hives    Green dye in xanax     Physical Exam:   Vitals  Blood pressure 124/72, pulse 68, resp. rate 18, SpO2 95 %.  Physical Examination: General appearance - alert, well appearing, and in no distress and  Mental status - alert, oriented to person, place, and time,  Eyes - sclera anicteric Neck - supple, no  JVD elevation , Chest - clear  to auscultation bilaterally, symmetrical air movement,  Heart - S1 and S2 normal, regular  Abdomen - soft, nontender, nondistended, no masses or organomegaly Neurological - screening mental status exam normal, neck supple without rigidity, -subjective left-sided facial and left upper extremity numbness, no significant motor  deficits, gait is steady Extremities - no pedal edema noted, intact peripheral pulses  Skin - warm, dry     Data Review:    CBC Recent Labs  Lab 02/13/19 1317 02/13/19 1327  WBC  --  12.3*  HGB 15.3* 15.1*  HCT 45.0 47.3*  PLT  --  293  MCV  --  93.7  MCH  --  29.9  MCHC  --  31.9  RDW  --  12.9  LYMPHSABS  --  2.1  MONOABS  --  0.6  EOSABS  --  0.0  BASOSABS  --  0.0   ------------------------------------------------------------------------------------------------------------------  Chemistries  Recent Labs  Lab 02/13/19 1317 02/13/19 1327  NA 140 139  K 4.3 4.3  CL 105 106  CO2  --  23  GLUCOSE 101* 102*  BUN 20 20  CREATININE 0.70 0.70  CALCIUM  --  9.4  AST  --  22  ALT  --  30  ALKPHOS  --  55  BILITOT  --  1.1   ------------------------------------------------------------------------------------------------------------------ CrCl cannot be calculated (Unknown ideal weight.). ------------------------------------------------------------------------------------------------------------------ No results for input(s): TSH, T4TOTAL, T3FREE, THYROIDAB in the last 72 hours.  Invalid input(s): FREET3   Coagulation profile Recent Labs  Lab 02/13/19 1327  INR 1.0   ------------------------------------------------------------------------------------------------------------------- No results for input(s): DDIMER in the last 72 hours. -------------------------------------------------------------------------------------------------------------------  Cardiac Enzymes No results for input(s): CKMB, TROPONINI,  MYOGLOBIN in the last 168 hours.  Invalid input(s): CK ------------------------------------------------------------------------------------------------------------------ No results found for: BNP   ---------------------------------------------------------------------------------------------------------------  Urinalysis    Component Value Date/Time   COLORURINE YELLOW 02/13/2019 1327   APPEARANCEUR HAZY (A) 02/13/2019 1327   LABSPEC 1.017 02/13/2019 1327   PHURINE 5.0 02/13/2019 1327   GLUCOSEU >=500 (A) 02/13/2019 1327   HGBUR NEGATIVE 02/13/2019 1327   BILIRUBINUR NEGATIVE 02/13/2019 1327   KETONESUR NEGATIVE 02/13/2019 [place] 02/13/2019 1327   NITRITE NEGATIVE 02/13/2019 1327   [place] 02/13/2019 1327    ----------------------------------------------------------------------------------------------------------------   Imaging Results:    Mr Brain Wo Contrast  Result Date: 02/13/2019 CLINICAL DATA:  Syncopal episode today. Numbness of the left side of the face and left hand. EXAM: MRI HEAD WITHOUT CONTRAST TECHNIQUE: Multiplanar, multiecho pulse sequences of the brain and surrounding structures were obtained without intravenous contrast. COMPARISON:  Head CT same day FINDINGS: Brain: Diffusion imaging does not show any definite acute or subacute infarction. One could question a minimal lateral thalamic infarction on the right, 1 or 2 mm in size, but this is not definite and is not confirmed on the coronal imaging. Elsewhere, brainstem shows minimal small vessel change of the pons. No focal cerebellar insult. Cerebral hemispheres show moderate chronic small-vessel ischemic changes of white matter, advanced for age. No cortical or large vessel territory infarction. No mass lesion, hemorrhage, hydrocephalus or extra-axial collection. Vascular: Major vessels at the base of the brain show flow. Skull and upper cervical spine: Negative Sinuses/Orbits:  Clear/normal Other: None IMPRESSION: No definite acute infarction. One could question a punctate acute infarction along the lateral thalamus on the right. This is not confirmed on the coronal imaging. Moderate chronic small-vessel ischemic changes elsewhere throughout the brain, advanced for age. Electronically Signed   By: Nelson Chimes M.D.   On: 02/13/2019 15:41   US Carotid Bilateral  Result Date: 02/13/2019 CLINICAL DATA:  Syncope EXAM: BILATERAL CAROTID DUPLEX ULTRASOUND TECHNIQUE: Pearline Cables scale imaging, color Doppler and duplex ultrasound were performed of bilateral carotid and vertebral arteries in the neck.  COMPARISON:  None. FINDINGS: Criteria: Quantification of carotid stenosis is based on velocity parameters that correlate the residual internal carotid diameter with NASCET-based stenosis levels, using the diameter of the distal internal carotid lumen as the denominator for stenosis measurement. The following velocity measurements were obtained: RIGHT ICA: 97/31 cm/sec CCA: 123XX123 cm/sec SYSTOLIC ICA/CCA RATIO:  1.2 ECA: 90 cm/sec LEFT ICA: 90/35 cm/sec CCA: 0000000 cm/sec SYSTOLIC ICA/CCA RATIO:  1.0 ECA: 74 cm/sec RIGHT CAROTID ARTERY: Mild smooth plaque in the carotid bulb and proximal ICA without high-grade stenosis. Normal waveforms and color Doppler signal. RIGHT VERTEBRAL ARTERY:  Normal flow direction and waveform. LEFT CAROTID ARTERY: Mild eccentric partially calcified plaque in the bulb and ICA origin. No high-grade stenosis. Normal waveforms and color Doppler signal. LEFT VERTEBRAL ARTERY:  Normal flow direction and waveform. IMPRESSION: 1. Bilateral carotid bifurcation plaque resulting in less than 50% diameter ICA stenosis. 2. Antegrade bilateral vertebral arterial flow. Electronically Signed   By: Lucrezia Europe M.D.   On: 02/13/2019 15:22   Ct Head Code Stroke Wo Contrast  Result Date: 02/13/2019 CLINICAL DATA:  Code stroke. Numbness or tingling. Paresthesia. Left facial numbness. EXAM: CT  HEAD WITHOUT CONTRAST TECHNIQUE: Contiguous axial images were obtained from the base of the skull through the vertex without intravenous contrast. COMPARISON:  None. FINDINGS: Brain: No evidence of acute infarction, hemorrhage, hydrocephalus, extra-axial collection or mass lesion/mass effect. Patchy white matter hypodensity bilaterally is symmetric and most likely due to chronic microvascular ischemia. Vascular: Negative for hyperdense vessel Skull: Negative Sinuses/Orbits: Negative Other: None ASPECTS ([place] Stroke Program Early CT Score) - Ganglionic level infarction (caudate, lentiform nuclei, internal capsule, insula, M1-M3 cortex): 7 - Supraganglionic infarction (M4-M6 cortex): 3 Total score (0-10 with 10 being normal): 10 IMPRESSION: 1. No acute intracranial abnormality 2. ASPECTS is 10 3. Patchy white matter disease bilaterally most likely chronic microvascular ischemia. 4. These results were called by telephone at the time of interpretation on 02/13/2019 at 1:21 pm to provider JULIE HAVILAND , who verbally acknowledged these results. Electronically Signed   By: Franchot Gallo M.D.   On: 02/13/2019 13:22    Radiological Exams on Admission: Mr Brain Wo Contrast  Result Date: 02/13/2019 CLINICAL DATA:  Syncopal episode today. Numbness of the left side of the face and left hand. EXAM: MRI HEAD WITHOUT CONTRAST TECHNIQUE: Multiplanar, multiecho pulse sequences of the brain and surrounding structures were obtained without intravenous contrast. COMPARISON:  Head CT same day FINDINGS: Brain: Diffusion imaging does not show any definite acute or subacute infarction. One could question a minimal lateral thalamic infarction on the right, 1 or 2 mm in size, but this is not definite and is not confirmed on the coronal imaging. Elsewhere, brainstem shows minimal small vessel change of the pons. No focal cerebellar insult. Cerebral hemispheres show moderate chronic small-vessel ischemic changes of white matter,  advanced for age. No cortical or large vessel territory infarction. No mass lesion, hemorrhage, hydrocephalus or extra-axial collection. Vascular: Major vessels at the base of the brain show flow. Skull and upper cervical spine: Negative Sinuses/Orbits: Clear/normal Other: None IMPRESSION: No definite acute infarction. One could question a punctate acute infarction along the lateral thalamus on the right. This is not confirmed on the coronal imaging. Moderate chronic small-vessel ischemic changes elsewhere throughout the brain, advanced for age. Electronically Signed   By: Nelson Chimes M.D.   On: 02/13/2019 15:41   US Carotid Bilateral  Result Date: 02/13/2019 CLINICAL DATA:  Syncope EXAM: BILATERAL CAROTID DUPLEX ULTRASOUND TECHNIQUE:  Gray scale imaging, color Doppler and duplex ultrasound were performed of bilateral carotid and vertebral arteries in the neck. COMPARISON:  None. FINDINGS: Criteria: Quantification of carotid stenosis is based on velocity parameters that correlate the residual internal carotid diameter with NASCET-based stenosis levels, using the diameter of the distal internal carotid lumen as the denominator for stenosis measurement. The following velocity measurements were obtained: RIGHT ICA: 97/31 cm/sec CCA: 123XX123 cm/sec SYSTOLIC ICA/CCA RATIO:  1.2 ECA: 90 cm/sec LEFT ICA: 90/35 cm/sec CCA: 0000000 cm/sec SYSTOLIC ICA/CCA RATIO:  1.0 ECA: 74 cm/sec RIGHT CAROTID ARTERY: Mild smooth plaque in the carotid bulb and proximal ICA without high-grade stenosis. Normal waveforms and color Doppler signal. RIGHT VERTEBRAL ARTERY:  Normal flow direction and waveform. LEFT CAROTID ARTERY: Mild eccentric partially calcified plaque in the bulb and ICA origin. No high-grade stenosis. Normal waveforms and color Doppler signal. LEFT VERTEBRAL ARTERY:  Normal flow direction and waveform. IMPRESSION: 1. Bilateral carotid bifurcation plaque resulting in less than 50% diameter ICA stenosis. 2. Antegrade  bilateral vertebral arterial flow. Electronically Signed   By: Lucrezia Europe M.D.   On: 02/13/2019 15:22   Ct Head Code Stroke Wo Contrast  Result Date: 02/13/2019 CLINICAL DATA:  Code stroke. Numbness or tingling. Paresthesia. Left facial numbness. EXAM: CT HEAD WITHOUT CONTRAST TECHNIQUE: Contiguous axial images were obtained from the base of the skull through the vertex without intravenous contrast. COMPARISON:  None. FINDINGS: Brain: No evidence of acute infarction, hemorrhage, hydrocephalus, extra-axial collection or mass lesion/mass effect. Patchy white matter hypodensity bilaterally is symmetric and most likely due to chronic microvascular ischemia. Vascular: Negative for hyperdense vessel Skull: Negative Sinuses/Orbits: Negative Other: None ASPECTS ([place] Stroke Program Early CT Score) - Ganglionic level infarction (caudate, lentiform nuclei, internal capsule, insula, M1-M3 cortex): 7 - Supraganglionic infarction (M4-M6 cortex): 3 Total score (0-10 with 10 being normal): 10 IMPRESSION: 1. No acute intracranial abnormality 2. ASPECTS is 10 3. Patchy white matter disease bilaterally most likely chronic microvascular ischemia. 4. These results were called by telephone at the time of interpretation on 02/13/2019 at 1:21 pm to provider JULIE HAVILAND , who verbally acknowledged these results. Electronically Signed   By: Franchot Gallo M.D.   On: 02/13/2019 13:22    DVT Prophylaxis -SCD /heparin AM Labs Ordered, also please review Full Orders  Family Communication: Admission, patients condition and plan of care including tests being ordered have been discussed with the patient  who indicate understanding and agree with the plan   Code Status - Full Code  Likely DC to  Home after EEG-if no significant arrhythmias on telemetry  Condition   stable Roxan Hockey M.D on 02/13/2019 at 6:31 PM Go to [email or web address] -  for contact info  [facility] - Office  [phone]

## 2019-02-13 NOTE — Plan of Care (Signed)
  Problem: Nutrition: Goal: Adequate nutrition will be maintained 02/13/2019 2329 by Rubbie Battiest, RN Outcome: Progressing 02/13/2019 2329 by Rubbie Battiest, RN Outcome: Not Progressing   Problem: Coping: Goal: Level of anxiety will decrease 02/13/2019 2329 by Rubbie Battiest, RN Outcome: Progressing 02/13/2019 2329 by Rubbie Battiest, RN Outcome: Not Progressing   Problem: Pain Managment: Goal: General experience of comfort will improve 02/13/2019 2329 by Rubbie Battiest, RN Outcome: Progressing 02/13/2019 2329 by Rubbie Battiest, RN Outcome: Not Progressing   Problem: Safety: Goal: Ability to remain free from injury will improve 02/13/2019 2329 by Rubbie Battiest, RN Outcome: Progressing 02/13/2019 2329 by Rubbie Battiest, RN Outcome: Not Progressing

## 2019-02-13 NOTE — ED Triage Notes (Signed)
Pt reports she "blacked out" about 1 hour ago while at work. When she came back around pt reports she was feeling numbness in the left side of her face and lips and she feels as if she is drooling. Pt reports she felt completely normal prior to the LOC. LKW 1150.

## 2019-02-14 ENCOUNTER — Observation Stay (HOSPITAL_BASED_OUTPATIENT_CLINIC_OR_DEPARTMENT_OTHER)
Admit: 2019-02-14 | Discharge: 2019-02-14 | Disposition: A | Discharge status: Home / Self Care | Payer: Medicare Other | Attending: Family Medicine | Admitting: Family Medicine

## 2019-02-14 DIAGNOSIS — R55 Syncope and collapse: Secondary | ICD-10-CM | POA: Diagnosis not present

## 2019-02-14 DIAGNOSIS — G459 Transient cerebral ischemic attack, unspecified: Secondary | ICD-10-CM | POA: Diagnosis not present

## 2019-02-14 LAB — HIV ANTIBODY (ROUTINE TESTING W REFLEX): HIV Screen 4th Generation wRfx: NONREACTIVE

## 2019-02-14 LAB — BASIC METABOLIC PANEL
Anion gap: 6 (ref 5–15)
BUN: 23 mg/dL (ref 8–23)
CO2: 27 mmol/L (ref 22–32)
Calcium: 8.8 mg/dL — ABNORMAL LOW (ref 8.9–10.3)
Chloride: 107 mmol/L (ref 98–111)
Creatinine, Ser: 0.8 mg/dL (ref 0.44–1.00)
GFR calc Af Amer: >60 mL/min (ref >60)
GFR calc non Af Amer: >60 mL/min (ref >60)
Glucose, Bld: 98 mg/dL (ref 70–99)
Potassium: 4.7 mmol/L (ref 3.5–5.1)
Sodium: 140 mmol/L (ref 135–145)

## 2019-02-14 LAB — CBC
HCT: 47 % — ABNORMAL HIGH (ref 36.0–46.0)
Hemoglobin: 14.7 g/dL (ref 12.0–15.0)
MCH: 30.3 pg (ref 26.0–34.0)
MCHC: 31.3 g/dL (ref 30.0–36.0)
MCV: 96.9 fL (ref 80.0–100.0)
Platelets: 258 10*3/uL (ref 150–400)
RBC: 4.85 MIL/uL (ref 3.87–5.11)
RDW: 12.7 % (ref 11.5–15.5)
WBC: 9.7 10*3/uL (ref 4.0–10.5)
nRBC: 0 % (ref 0.0–0.2)

## 2019-02-14 NOTE — Discharge Summary (Signed)
Joyce [name] [facility], is a 63 y.o. female  DOB [birth year]  MRN [id number].  Admission date:  02/13/2019  Admitting Physician  Roxan Hockey, MD  Discharge Date:  02/14/2019   Primary MD  Sharion Balloon, FNP  Recommendations for primary care physician for things to follow:    1) please stop lisinopril your blood pressure is on the low side 2) follow-up with your primary care physician within a week for recheck and reevaluation  Admission Diagnosis  Syncope and collapse [R55] Near syncope [R55]   Discharge Diagnosis  Syncope and collapse [R55] Near syncope [R55]    Principal Problem:   TIA (transient ischemic attack) Vs Syncope Active Problems:   Hypercholesteremia   Anxiety   HTN (hypertension)      Past Medical History:  Diagnosis Date  . Anxiety   . Arthritis   . Cancer ([place])   . Headache(784.0)   . Hypercholesteremia     Past Surgical History:  Procedure Laterality Date  . ABDOMINAL HYSTERECTOMY    . COLONOSCOPY  04/05/2012   Procedure: COLONOSCOPY;  Surgeon: Rogene Houston, MD;  [facility];  Service: Endoscopy;  Laterality: N/A;  930  . COLONOSCOPY N/A 08/17/2016   Procedure: COLONOSCOPY;  Surgeon: Danie Binder, MD;  [facility];  Service: Endoscopy;  Laterality: N/A;  10:00am  . HEMORRHOID BANDING N/A 08/17/2016   Procedure: HEMORRHOID BANDING;  Surgeon: Danie Binder, MD;  [facility];  Service: Endoscopy;  Laterality: N/A;  . INCISION AND DRAINAGE Right 08/01/2015   Procedure: INCISION AND DRAINAGE RIGHT RING FINGER;  Surgeon: Carole Civil, MD;  Location: [facility];  Service: Orthopedics;  Laterality: Right;       HPI  from the history and physical done on the day of admission:    -  Joyce [name]  is a 63 y.o. female with past medical history relevant for anxiety disorder, arthritis, HTN and HLD who presents to the ED with concerns  about near syncopal episode while at work about an hour PTA -Patient says she almost passed out while at work when she came around she has significant numbness over the left side of her face and left upper extremity  -No chest pains, no palpitations, -No tongue biting or incontinence --Patient apparently was drooling when she came around, ??  Seizures  -Patient has right, chronic vision problems  In ED--- CT head was unremarkable  _EDP consulted telemetry neurology who recommended observation and further neuro work-up    Hospital Course:   - 1)Syncope-   telemetry monitored unit no significant arrhythmias, patient ruled out for ACS by troponins and EKG, echocardiogram without significant aortic stenosis or other outflow obstruction,  EF 60 to 65%, without segmental/Regional wall motion abnormalities.  carotid artery Dopplers without hemodynamically significant stenosis -CT head and MRI brain without definite stroke -EEG without significant abnormality -BP soft, avoid standing up too quickly, -Stop lisinopril -Orthostatic vitals prior to discharge were normal --Soft BPs may have contributed to syncopal  event   2)HTN--stable, stopped lisinopril  3) arthritis--restart home meds  4) anxiety disorder--- continue Lexapro, use Xanax , may have Benadryl as needed  5)HLD- continue Crestor and Zetia  Discharge Condition: stable  Follow UP--PCP for recheck and reevaluation   Diet and Activity recommendation:  As advised  Discharge Instructions    Discharge Instructions    Call MD for:  difficulty breathing, headache or visual disturbances   Complete by: As directed    Call MD for:  persistant dizziness or light-headedness   Complete by: As directed    Call MD for:  persistant nausea and vomiting   Complete by: As directed    Call MD for:  severe uncontrolled pain   Complete by: As directed    Call MD for:  temperature >100.4   Complete by: As directed    Diet - low  sodium heart healthy   Complete by: As directed    Discharge instructions   Complete by: As directed    1) please stop lisinopril your blood pressure is on the low side 2) follow-up with your primary care physician within a week for recheck and reevaluation   Increase activity slowly   Complete by: As directed         Discharge Medications     Allergies as of 02/14/2019      Reactions   Other Hives   Green dye in xanax      Medication List    STOP taking these medications   lisinopril 20 MG tablet Commonly known as: ZESTRIL     TAKE these medications   acetaminophen 500 MG tablet Commonly known as: TYLENOL Take 1,000 mg by mouth daily as needed for headache.   ALPRAZolam 1 MG tablet Commonly known as: XANAX Take 1 tablet (1 mg total) by mouth 3 (three) times daily.   augmented betamethasone dipropionate 0.05 % ointment Commonly known as: DIPROLENE-AF Apply 1 application topically 2 (two) times daily.   betamethasone dipropionate 0.05 % cream Apply  a small amount to affected area twice a day as needed APPLY TWICE A DAY AND THEN AS NEEDED FOR FLARE/ITCHING   celecoxib 200 MG capsule Commonly known as: CELEBREX Take 1 capsule (200 mg total) by mouth every evening.   diphenhydrAMINE 25 mg capsule Commonly known as: BENADRYL Take 1 capsule (25 mg total) by mouth 3 (three) times daily. What changed:   when to take this  reasons to take this   escitalopram 20 MG tablet Commonly known as: Lexapro Take 1 tablet (20 mg total) by mouth daily.   ezetimibe 10 MG tablet Commonly known as: ZETIA Take 1 tablet (10 mg total) by mouth daily.   folic acid 1 MG tablet Commonly known as: FOLVITE Take 1 mg by mouth as directed.   methotrexate 2.5 MG tablet Commonly known as: RHEUMATREX Take 3 tablets by mouth once a week.   rosuvastatin 20 MG tablet Commonly known as: CRESTOR Take 1 tablet (20 mg total) by mouth every evening.       Major procedures and  Radiology Reports - PLEASE review detailed and final reports for all details, in brief -    Mr Brain Wo Contrast  Result Date: 02/13/2019 CLINICAL DATA:  Syncopal episode today. Numbness of the left side of the face and left hand. EXAM: MRI HEAD WITHOUT CONTRAST TECHNIQUE: Multiplanar, multiecho pulse sequences of the brain and surrounding structures were obtained without intravenous contrast. COMPARISON:  Head CT same day FINDINGS: Brain: Diffusion imaging  does not show any definite acute or subacute infarction. One could question a minimal lateral thalamic infarction on the right, 1 or 2 mm in size, but this is not definite and is not confirmed on the coronal imaging. Elsewhere, brainstem shows minimal small vessel change of the pons. No focal cerebellar insult. Cerebral hemispheres show moderate chronic small-vessel ischemic changes of white matter, advanced for age. No cortical or large vessel territory infarction. No mass lesion, hemorrhage, hydrocephalus or extra-axial collection. Vascular: Major vessels at the base of the brain show flow. Skull and upper cervical spine: Negative Sinuses/Orbits: Clear/normal Other: None IMPRESSION: No definite acute infarction. One could question a punctate acute infarction along the lateral thalamus on the right. This is not confirmed on the coronal imaging. Moderate chronic small-vessel ischemic changes elsewhere throughout the brain, advanced for age. Electronically Signed   By: Nelson Chimes M.D.   On: 02/13/2019 15:41   US Carotid Bilateral  Result Date: 02/13/2019 CLINICAL DATA:  Syncope EXAM: BILATERAL CAROTID DUPLEX ULTRASOUND TECHNIQUE: Pearline Cables scale imaging, color Doppler and duplex ultrasound were performed of bilateral carotid and vertebral arteries in the neck. COMPARISON:  None. FINDINGS: Criteria: Quantification of carotid stenosis is based on velocity parameters that correlate the residual internal carotid diameter with NASCET-based stenosis levels,  using the diameter of the distal internal carotid lumen as the denominator for stenosis measurement. The following velocity measurements were obtained: RIGHT ICA: 97/31 cm/sec CCA: 123XX123 cm/sec SYSTOLIC ICA/CCA RATIO:  1.2 ECA: 90 cm/sec LEFT ICA: 90/35 cm/sec CCA: 0000000 cm/sec SYSTOLIC ICA/CCA RATIO:  1.0 ECA: 74 cm/sec RIGHT CAROTID ARTERY: Mild smooth plaque in the carotid bulb and proximal ICA without high-grade stenosis. Normal waveforms and color Doppler signal. RIGHT VERTEBRAL ARTERY:  Normal flow direction and waveform. LEFT CAROTID ARTERY: Mild eccentric partially calcified plaque in the bulb and ICA origin. No high-grade stenosis. Normal waveforms and color Doppler signal. LEFT VERTEBRAL ARTERY:  Normal flow direction and waveform. IMPRESSION: 1. Bilateral carotid bifurcation plaque resulting in less than 50% diameter ICA stenosis. 2. Antegrade bilateral vertebral arterial flow. Electronically Signed   By: Lucrezia Europe M.D.   On: 02/13/2019 15:22   Mm 3d Screen Breast Bilateral  Result Date: 01/28/2019 CLINICAL DATA:  Screening. EXAM: DIGITAL SCREENING BILATERAL MAMMOGRAM WITH TOMO AND CAD COMPARISON:  Previous exam(s). ACR Breast Density Category a: The breast tissue is almost entirely fatty. FINDINGS: There are no findings suspicious for malignancy. Images were processed with CAD. IMPRESSION: No mammographic evidence of malignancy. A result letter of this screening mammogram will be mailed directly to the patient. RECOMMENDATION: Screening mammogram in one year. (Code:SM-B-01Y) BI-RADS CATEGORY  1: Negative. Electronically Signed   By: Lajean Manes M.D.   On: 01/28/2019 13:40   Ct Head Code Stroke Wo Contrast  Result Date: 02/13/2019 CLINICAL DATA:  Code stroke. Numbness or tingling. Paresthesia. Left facial numbness. EXAM: CT HEAD WITHOUT CONTRAST TECHNIQUE: Contiguous axial images were obtained from the base of the skull through the vertex without intravenous contrast. COMPARISON:  None.  FINDINGS: Brain: No evidence of acute infarction, hemorrhage, hydrocephalus, extra-axial collection or mass lesion/mass effect. Patchy white matter hypodensity bilaterally is symmetric and most likely due to chronic microvascular ischemia. Vascular: Negative for hyperdense vessel Skull: Negative Sinuses/Orbits: Negative Other: None ASPECTS ([place] Stroke Program Early CT Score) - Ganglionic level infarction (caudate, lentiform nuclei, internal capsule, insula, M1-M3 cortex): 7 - Supraganglionic infarction (M4-M6 cortex): 3 Total score (0-10 with 10 being normal): 10 IMPRESSION: 1. No acute intracranial abnormality 2.  ASPECTS is 10 3. Patchy white matter disease bilaterally most likely chronic microvascular ischemia. 4. These results were called by telephone at the time of interpretation on 02/13/2019 at 1:21 pm to provider JULIE HAVILAND , who verbally acknowledged these results. Electronically Signed   By: Franchot Gallo M.D.   On: 02/13/2019 13:22    Micro Results    Recent Results (from the past 240 hour(s))  SARS CORONAVIRUS 2 (TAT 6-24 HRS) Nasopharyngeal Nasopharyngeal Swab     Status: None   Collection Time: 02/13/19  2:20 PM   Specimen: Nasopharyngeal Swab  Result Value Ref Range Status   SARS Coronavirus 2 NEGATIVE NEGATIVE Final    Comment: (NOTE) SARS-CoV-2 target nucleic acids are NOT DETECTED. The SARS-CoV-2 RNA is generally detectable in upper and lower respiratory specimens during the acute phase of infection. Negative results do not preclude SARS-CoV-2 infection, do not rule out co-infections with other pathogens, and should not be used as the sole basis for treatment or other patient management decisions. Negative results must be combined with clinical observations, patient history, and epidemiological information. The expected result is Negative. Fact Sheet for Patients: SugarRoll.be Fact Sheet for Healthcare Providers:  https://www.woods-mathews.com/ This test is not yet approved or cleared by the [place] FDA and  has been authorized for detection and/or diagnosis of SARS-CoV-2 by FDA under an Emergency Use Authorization (EUA). This EUA will remain  in effect (meaning this test can be used) for the duration of the COVID-19 declaration under Section 56 4(b)(1) of the Act, 21 U.S.C. section 360bbb-3(b)(1), unless the authorization is terminated or revoked sooner. Performed at [facility], [place] [address], [place], [place] [address]        Today   Subjective    Urooj Sereno today has no new complaints -No chest pains, no palpitations, no dizziness, no dyspnea on exertion no leg pains no pleuritic symptoms          Patient has been seen and examined prior to discharge   Objective   Blood pressure 107/76, pulse 68, temperature 97.7 F (36.5 C), temperature source Oral, resp. rate 20, SpO2 96 %.   Intake/Output Summary (Last 24 hours) at 02/14/2019 1811 Last data filed at 02/14/2019 0900 Gross per 24 hour  Intake 240 ml  Output -  Net 240 ml    Exam Gen:- Awake Alert, no acute distress  HEENT:- [place].AT, No sclera icterus Neck-Supple Neck,No JVD,.  Lungs-  CTAB , good air movement bilaterally  CV- S1, S2 normal, regular Abd-  +ve B.Sounds, Abd Soft, No tenderness,    Extremity/Skin:- No  edema,   good pulses Psych-affect is appropriate, oriented x3 Neuro-no new focal deficits, no tremors  -Her neuro exam is reassuring back to baseline   Data Review   CBC w Diff:  Lab Results  Component Value Date   WBC 9.7 02/14/2019   HGB 14.7 02/14/2019   HGB 13.3 06/04/2018   HCT 47.0 (H) 02/14/2019   HCT 39.5 06/04/2018   PLT 258 02/14/2019   PLT 304 06/04/2018   LYMPHOPCT 17 02/13/2019   MONOPCT 5 02/13/2019   EOSPCT 0 02/13/2019   BASOPCT 0 02/13/2019    CMP:  Lab Results  Component Value Date   NA 140 02/14/2019   NA 144 06/04/2018   K 4.7 02/14/2019    CL 107 02/14/2019   CO2 27 02/14/2019   BUN 23 02/14/2019   BUN 14 06/04/2018   CREATININE 0.80 02/14/2019   PROT 7.4  02/13/2019   PROT 6.2 06/04/2018   ALBUMIN 4.5 02/13/2019   ALBUMIN 4.1 06/04/2018   BILITOT 1.1 02/13/2019   BILITOT 0.3 06/04/2018   ALKPHOS 55 02/13/2019   AST 22 02/13/2019   ALT 30 02/13/2019  .   Total Discharge time is about 33 minutes  Roxan Hockey M.D on 02/14/2019 at 6:11 PM  Go to [email or web address] -  for contact info  [facility] - Office  [phone]

## 2019-02-14 NOTE — Care Management Obs Status (Signed)
[place] NOTIFICATION   Patient Details  Name: Joyce [name] MRN: [id number] Date of Birth: 1956-02-13   Medicare Observation Status Notification Given:  Yes    Tommy Medal 02/14/2019, 1:17 PM

## 2019-02-14 NOTE — Progress Notes (Signed)
EEG completed, results pending. 

## 2019-02-14 NOTE — Procedures (Signed)
Patient Name: SAPHIRA MESSANA  MRN: [id number]  Epilepsy Attending: Lora Havens  Referring Physician/Provider: Dr Roxan Hockey Date:  02/14/2019 Duration: 25.16 mins  Patient history: 63yo f with syncopal event. EEG to evaluate for seizure.  Level of alertness: awake  AEDs during EEG study: Xanax  Technical aspects: This EEG study was done with scalp electrodes positioned according to the 10-20 International system of electrode placement. Electrical activity was acquired at a sampling rate of 500Hz  and reviewed with a high frequency filter of 70Hz  and a low frequency filter of 1Hz . EEG data were recorded continuously and digitally stored.   DESCRIPTION:  EEG showed an excessive amount of 15 to 18 Hz, 2-3 uV beta activity with irregular morphology distributed symmetrically and diffusely.  There was also intermittent 3-4hz  delta slowing in left temporal region. Physiologic photic driving was seen during photic stimulation.  Left temporal 3-4hz  delta slowing was again during hyperventilation.  ABNORMALITY - Intermittent slow, left temporal - excessive beta, generalized   IMPRESSION: This study is suggestive of non specific cortical dysfunction in left temporal region. No seizures or epileptiform discharges were seen throughout the recording.  The excessive beta activity seen in the background is most likely due to the effect of benzodiazepine and is a benign EEG pattern.    Shaneen Reeser Barbra Sarks

## 2019-03-05 ENCOUNTER — Encounter: Payer: Self-pay | Admitting: Adult Health

## 2019-03-05 ENCOUNTER — Other Ambulatory Visit: Payer: Self-pay

## 2019-03-05 ENCOUNTER — Ambulatory Visit (INDEPENDENT_AMBULATORY_CARE_PROVIDER_SITE_OTHER): Payer: Medicare Other | Admitting: Adult Health

## 2019-03-05 VITALS — BP 144/94 | HR 74 | Ht 61.25 in | Wt 167.0 lb

## 2019-03-05 DIAGNOSIS — Z8541 Personal history of malignant neoplasm of cervix uteri: Secondary | ICD-10-CM | POA: Insufficient documentation

## 2019-03-05 DIAGNOSIS — Z01419 Encounter for gynecological examination (general) (routine) without abnormal findings: Secondary | ICD-10-CM

## 2019-03-05 DIAGNOSIS — Z1212 Encounter for screening for malignant neoplasm of rectum: Secondary | ICD-10-CM

## 2019-03-05 DIAGNOSIS — Z1211 Encounter for screening for malignant neoplasm of colon: Secondary | ICD-10-CM | POA: Insufficient documentation

## 2019-03-05 LAB — HEMOCCULT GUIAC POC 1CARD (OFFICE): Fecal Occult Blood, POC: NEGATIVE

## 2019-03-05 NOTE — Progress Notes (Signed)
Patient ID: Joyce [name] [facility], female   DOB: Sep 17, 1955, 63 y.o.   MRN: [id number] History of Present Illness:  Zaraiah is a 63 year old white female, sp hysterectomy over 35 years ago for cervical cancer by Dr Heide Spark, she is in for well woman gyn exam.She had normal pap with negative HPV 05/22/17. She works at [employer] PCP is Evelina Dun NP at [facility].   Current Medications, Allergies, Past Medical History, Past Surgical History, Family History and Social History were reviewed in [employer] record.     Review of Systems: Patient denies any headaches, hearing loss, fatigue, blurred vision, shortness of breath, chest pain, abdominal pain, problems with bowel movements, urination, or intercourse. No joint pain or mood swings.    Physical Exam:BP (!) 144/94 (BP Location: Left Arm, Patient Position: Sitting, Cuff Size: Normal)   Pulse 74   Ht 5' 1.25" (1.556 m)   Wt 167 lb (75.8 kg)   BMI 31.30 kg/m  General:  Well developed, well nourished, no acute distress Skin:  Warm and dry Neck:  Midline trachea, normal thyroid, good ROM, no lymphadenopathy, no carotid bruits heard  Lungs; Clear to auscultation bilaterally Breast:  No dominant palpable mass, retraction, or nipple discharge Cardiovascular: Regular rate and rhythm Abdomen:  Soft, non tender, no hepatosplenomegaly Pelvic:  External genitalia is normal in appearance, no lesions.  The vagina is pale with loss of moisture and rugae. Urethra has no lesions or masses. The cervix and uterus are absent.  No adnexal masses or tenderness noted.Bladder is non tender, no masses felt. Rectal: Good sphincter tone, no polyps, +hemorrhoids felt.  Hemoccult negative. Extremities/musculoskeletal:  No swelling or varicosities noted, no clubbing or cyanosis.she has white cotton glove on right hand, has psoriasis  Psych:  No mood changes, alert and cooperative,seems happy Fall risk is low PHQ 2 score is 0 Examination  chaperoned by Levy Pupa LPN.  Impression and Plan: 1. Encounter for well woman exam with routine gynecological exam Physical in 1 year Mammogram yearly, was normal 01/28/2019 Labs with PCP  DEXA in near future, can talk with PCP   2. Screening for colorectal cancer Colonoscopy 2023  3. History of cervical cancer No pap performed

## 2019-03-06 ENCOUNTER — Other Ambulatory Visit: Payer: Medicare Other

## 2019-04-13 ENCOUNTER — Other Ambulatory Visit: Payer: Self-pay | Admitting: Family

## 2019-04-13 DIAGNOSIS — M159 Polyosteoarthritis, unspecified: Secondary | ICD-10-CM

## 2019-04-24 ENCOUNTER — Other Ambulatory Visit: Payer: Self-pay

## 2019-04-24 ENCOUNTER — Other Ambulatory Visit: Payer: Medicare Other

## 2019-05-01 ENCOUNTER — Other Ambulatory Visit: Payer: Self-pay

## 2019-05-02 ENCOUNTER — Other Ambulatory Visit: Payer: Self-pay

## 2019-05-02 ENCOUNTER — Encounter: Payer: Self-pay | Admitting: Family

## 2019-05-02 ENCOUNTER — Ambulatory Visit (INDEPENDENT_AMBULATORY_CARE_PROVIDER_SITE_OTHER): Payer: Medicare Other | Admitting: Family

## 2019-05-02 ENCOUNTER — Ambulatory Visit (INDEPENDENT_AMBULATORY_CARE_PROVIDER_SITE_OTHER): Payer: Medicare Other

## 2019-05-02 VITALS — BP 126/75 | HR 70 | Temp 98.0°F | Ht 61.25 in | Wt 171.0 lb

## 2019-05-02 DIAGNOSIS — F419 Anxiety disorder, unspecified: Secondary | ICD-10-CM | POA: Diagnosis not present

## 2019-05-02 DIAGNOSIS — E669 Obesity, unspecified: Secondary | ICD-10-CM | POA: Diagnosis not present

## 2019-05-02 DIAGNOSIS — F1320 Sedative, hypnotic or anxiolytic dependence, uncomplicated: Secondary | ICD-10-CM | POA: Diagnosis not present

## 2019-05-02 DIAGNOSIS — Z23 Encounter for immunization: Secondary | ICD-10-CM | POA: Diagnosis not present

## 2019-05-02 DIAGNOSIS — Z780 Asymptomatic menopausal state: Secondary | ICD-10-CM | POA: Diagnosis not present

## 2019-05-02 DIAGNOSIS — Z0000 Encounter for general adult medical examination without abnormal findings: Secondary | ICD-10-CM | POA: Diagnosis not present

## 2019-05-02 DIAGNOSIS — Z79899 Other long term (current) drug therapy: Secondary | ICD-10-CM

## 2019-05-02 MED ORDER — ALPRAZOLAM 1 MG PO TABS: 1.0000 mg | ORAL_TABLET | Freq: Three times a day (TID) | ORAL | 5 refills | Status: DC

## 2019-05-02 MED ORDER — BUSPIRONE HCL 5 MG PO TABS: 5.0000 mg | ORAL_TABLET | Freq: Three times a day (TID) | ORAL | 1 refills | Status: DC

## 2019-05-02 NOTE — Progress Notes (Signed)
Subjective:    Joyce [name] is a 64 y.o. female who presents for a Welcome to Medicare exam. She is also requesting her xanax refilled. She   Review of Systems No complaints today        Objective:    Today's Vitals   05/02/19 1432  BP: 126/75  Pulse: 70  Temp: 98 F (36.7 C)  TempSrc: Temporal  SpO2: 98%  Weight: 171 lb (77.6 kg)  Height: 5' 1.25" (1.556 m)  Body mass index is 32.05 kg/m.  Medications Outpatient Encounter Medications as of 05/02/2019  Medication Sig  . acetaminophen (TYLENOL) 500 MG tablet Take 1,000 mg by mouth daily as needed for headache.  . ALPRAZolam (XANAX) 1 MG tablet Take 1 tablet (1 mg total) by mouth 3 (three) times daily.  Marland Kitchen augmented betamethasone dipropionate (DIPROLENE-AF) 0.05 % ointment Apply 1 application topically. Twice a week  . celecoxib (CELEBREX) 200 MG capsule TAKE (1) CAPSULE DAILY EVENING  . diphenhydrAMINE (BENADRYL) 25 mg capsule Take 1 capsule (25 mg total) by mouth 3 (three) times daily. (Patient taking differently: Take 25 mg by mouth daily as needed for itching. )  . escitalopram (LEXAPRO) 20 MG tablet Take 1 tablet (20 mg total) by mouth daily.  Marland Kitchen ezetimibe (ZETIA) 10 MG tablet Take 1 tablet (10 mg total) by mouth daily.  . folic acid (FOLVITE) 1 MG tablet Take 1 mg by mouth as directed.  . methotrexate (RHEUMATREX) 2.5 MG tablet Take 3 tablets by mouth once a week.  . rosuvastatin (CRESTOR) 20 MG tablet Take 1 tablet (20 mg total) by mouth every evening.   No facility-administered encounter medications on file as of 05/02/2019.     History: Past Medical History:  Diagnosis Date  . Anxiety   . Arthritis   . Cancer ([place])   . Headache(784.0)   . Hypercholesteremia   . Psoriasis   . Vaginal Pap smear, abnormal    cervical cancer   Past Surgical History:  Procedure Laterality Date  . ABDOMINAL HYSTERECTOMY    . COLONOSCOPY  04/05/2012   Procedure: COLONOSCOPY;  Surgeon: Rogene Houston, MD;  [facility];  Service: Endoscopy;  Laterality: N/A;  930  . COLONOSCOPY N/A 08/17/2016   Procedure: COLONOSCOPY;  Surgeon: Danie Binder, MD;  [facility];  Service: Endoscopy;  Laterality: N/A;  10:00am  . HEMORRHOID BANDING N/A 08/17/2016   Procedure: HEMORRHOID BANDING;  Surgeon: Danie Binder, MD;  [facility];  Service: Endoscopy;  Laterality: N/A;  . INCISION AND DRAINAGE Right 08/01/2015   Procedure: INCISION AND DRAINAGE RIGHT RING FINGER;  Surgeon: Carole Civil, MD;  Location: [facility];  Service: Orthopedics;  Laterality: Right;    Family History  Problem Relation Age of Onset  . Ovarian cancer Mother   . Bladder Cancer Father   . Lung cancer Sister   . Cancer Brother        liver  . Cancer Other   . Colon cancer Neg Hx    Social History   Occupational History    Employer: [employer]  Tobacco Use  . Smoking status: Former Smoker    Packs/day: 1.00    Years: 44.00    Pack years: 44.00    Types: Cigarettes  . Smokeless tobacco: Never Used  Substance and Sexual Activity  . Alcohol use: No    Alcohol/week: 0.0 standard drinks  . Drug use: No  . Sexual activity: Yes  Birth control/protection: Surgical    Comment: hyst    Tobacco Counseling Counseling given: Not Answered   Immunizations and Health Maintenance Immunization History  Administered Date(s) Administered  . Influenza,inj,Quad PF,6+ Mos 01/24/2017  . Influenza-Unspecified 01/24/2015, 01/16/2018, 01/18/2018  . Tdap 04/26/2015  . Zoster 01/29/2014   Health Maintenance Due  Topic Date Due  . DEXA SCAN  06/18/2018  . PNA vac Low Risk Adult (1 of 2 - PCV13) 06/18/2018  . INFLUENZA VACCINE  11/24/2018    Activities of Daily Living In your present state of health, do you have any difficulty performing the following activities: 02/13/2019  Hearing? N  Vision? N  Difficulty concentrating or making decisions? N  Walking or climbing stairs? N  Dressing or bathing? N  Doing errands,  shopping? N  Some recent data might be hidden    Physical Exam  WNL or other factors deemed appropriate based on the beneficiary's medical and social history and current clinical standards.  Advanced Directives: Does Patient Have a Medical Advance Directive?: No Would patient like information on creating a medical advance directive?: No - Patient declined    Assessment:    This is a routine wellness examination for this patient . Pt presents to the office today for a Welcome to Medicare visit. She currently lives at home by herself.   She is currently working 32-40 hours a week. She denies any problems at this time.   Vision/Hearing screen No exam data present  Dietary issues and exercise activities discussed:     Goals   None    Depression Screen PHQ 2/9 Scores 05/02/2019 03/05/2019 02/01/2018 01/01/2018  PHQ - 2 Score 0 0 0 0     Fall Risk Fall Risk  05/02/2019  Falls in the past year? 0  Number falls in past yr: -  Injury with Fall? -  Comment -    Cognitive Function: MMSE - [place] Exam 05/02/2019  Orientation to time 5  Orientation to Place 5  Registration 3  Attention/ Calculation 5  Recall 2  Language- name 2 objects 2  Language- repeat 1  Language- follow 3 step command 3  Language- read & follow direction 1  Write a sentence 1  Copy design 1  Total score 29        Patient Care Team: Sharion Balloon, FNP as PCP - General (Family Medicine)     Plan:     I have personally reviewed and noted the following in the patient's chart:   . Medical and social history . Use of alcohol, tobacco or illicit drugs  . Current medications and supplements . Functional ability and status . Nutritional status . Physical activity . Advanced directives . List of other physicians . Hospitalizations, surgeries, and ER visits in previous 12 months . Vitals . Screenings to include cognitive, depression, and falls . Referrals and appointments  In  addition, I have reviewed and discussed with patient certain preventive protocols, quality metrics, and best practice recommendations. A written personalized care plan for preventive services as well as general preventive health recommendations were provided to patient.     Evelina Dun, [place] 05/02/2019

## 2019-05-02 NOTE — Patient Instructions (Signed)

## 2019-05-02 NOTE — Addendum Note (Signed)
Addended by: Shelbie Ammons on: 05/02/2019 03:15 PM   Modules accepted: Orders

## 2019-05-09 ENCOUNTER — Telehealth: Payer: Self-pay | Admitting: Family

## 2019-05-09 MED ORDER — HYDROXYZINE PAMOATE 25 MG PO CAPS: 25.0000 mg | ORAL_CAPSULE | Freq: Three times a day (TID) | ORAL | 1 refills | Status: DC | PRN

## 2019-05-09 NOTE — Telephone Encounter (Signed)
Aware. 

## 2019-05-09 NOTE — Telephone Encounter (Signed)
Itching all over

## 2019-05-09 NOTE — Telephone Encounter (Signed)
If itching started after starting Buspar, stop Buspar. I have sent in vistaril for itching and it can be used for GAD too.

## 2019-05-09 NOTE — Telephone Encounter (Signed)
LMTCB

## 2019-07-19 ENCOUNTER — Other Ambulatory Visit: Payer: Self-pay | Admitting: Family

## 2019-07-19 DIAGNOSIS — E7800 Pure hypercholesterolemia, unspecified: Secondary | ICD-10-CM

## 2019-07-19 NOTE — Telephone Encounter (Signed)
OV 10/31/19 

## 2019-07-25 ENCOUNTER — Other Ambulatory Visit: Payer: Medicare Other

## 2019-07-25 ENCOUNTER — Other Ambulatory Visit: Payer: Self-pay

## 2019-07-25 DIAGNOSIS — Z79899 Other long term (current) drug therapy: Secondary | ICD-10-CM | POA: Diagnosis not present

## 2019-07-25 DIAGNOSIS — L409 Psoriasis, unspecified: Secondary | ICD-10-CM | POA: Diagnosis not present

## 2019-08-06 ENCOUNTER — Other Ambulatory Visit: Payer: Self-pay | Admitting: Family

## 2019-08-06 DIAGNOSIS — M159 Polyosteoarthritis, unspecified: Secondary | ICD-10-CM

## 2019-08-08 DIAGNOSIS — L400 Psoriasis vulgaris: Secondary | ICD-10-CM | POA: Diagnosis not present

## 2019-08-27 ENCOUNTER — Other Ambulatory Visit: Payer: Self-pay | Admitting: Family

## 2019-08-29 DIAGNOSIS — J0100 Acute maxillary sinusitis, unspecified: Secondary | ICD-10-CM | POA: Diagnosis not present

## 2019-08-29 DIAGNOSIS — R05 Cough: Secondary | ICD-10-CM | POA: Diagnosis not present

## 2019-10-23 ENCOUNTER — Other Ambulatory Visit: Payer: Self-pay | Admitting: Family

## 2019-10-23 DIAGNOSIS — E7800 Pure hypercholesterolemia, unspecified: Secondary | ICD-10-CM

## 2019-10-31 ENCOUNTER — Ambulatory Visit (INDEPENDENT_AMBULATORY_CARE_PROVIDER_SITE_OTHER): Payer: Medicare Other | Admitting: Family

## 2019-10-31 ENCOUNTER — Other Ambulatory Visit: Payer: Self-pay

## 2019-10-31 ENCOUNTER — Encounter: Payer: Self-pay | Admitting: Family

## 2019-10-31 VITALS — BP 122/89 | HR 84 | Temp 96.6°F | Ht 61.5 in | Wt 170.4 lb

## 2019-10-31 DIAGNOSIS — F419 Anxiety disorder, unspecified: Secondary | ICD-10-CM

## 2019-10-31 DIAGNOSIS — Z79899 Other long term (current) drug therapy: Secondary | ICD-10-CM | POA: Diagnosis not present

## 2019-10-31 DIAGNOSIS — M159 Polyosteoarthritis, unspecified: Secondary | ICD-10-CM

## 2019-10-31 DIAGNOSIS — F1320 Sedative, hypnotic or anxiolytic dependence, uncomplicated: Secondary | ICD-10-CM | POA: Diagnosis not present

## 2019-10-31 DIAGNOSIS — M8949 Other hypertrophic osteoarthropathy, multiple sites: Secondary | ICD-10-CM

## 2019-10-31 DIAGNOSIS — E7800 Pure hypercholesterolemia, unspecified: Secondary | ICD-10-CM

## 2019-10-31 DIAGNOSIS — E669 Obesity, unspecified: Secondary | ICD-10-CM

## 2019-10-31 DIAGNOSIS — M150 Primary generalized (osteo)arthritis: Secondary | ICD-10-CM

## 2019-10-31 DIAGNOSIS — I10 Essential (primary) hypertension: Secondary | ICD-10-CM

## 2019-10-31 MED ORDER — ALPRAZOLAM 1 MG PO TABS: 1.0000 mg | ORAL_TABLET | Freq: Three times a day (TID) | ORAL | 2 refills | Status: DC

## 2019-10-31 NOTE — Progress Notes (Signed)
Subjective:    Patient ID: Joyce [name] [facility], female    DOB: [birth year], 64 y.o.   MRN: [id number]  Chief Complaint  Patient presents with  . Hypertension    6 mth    PT presents to the office today for chronic follow up. She reports she could not take the Buspar or the Lexapro because it made her sick.  Hypertension This is a chronic problem. The current episode started more than 1 year ago. The problem has been resolved since onset. The problem is controlled. Associated symptoms include anxiety and malaise/fatigue. Pertinent negatives include no peripheral edema or shortness of breath. Risk factors for coronary artery disease include dyslipidemia, diabetes mellitus, obesity and sedentary lifestyle. The current treatment provides moderate improvement. There is no history of kidney disease, CVA or heart failure.  Arthritis Presents for follow-up visit. She complains of pain and stiffness. The symptoms have been stable. Affected locations include the left foot, right foot, left MCP and right MCP. Her pain is at a severity of 5/10.  Anxiety Presents for follow-up visit. Symptoms include depressed mood, excessive worry, irritability and nervous/anxious behavior. Patient reports no shortness of breath. Symptoms occur most days. The severity of symptoms is moderate. The quality of sleep is good.    Hyperlipidemia This is a chronic problem. The current episode started more than 1 year ago. The problem is controlled. Recent lipid tests were reviewed and are normal. Exacerbating diseases include obesity. Pertinent negatives include no shortness of breath. Current antihyperlipidemic treatment includes statins. The current treatment provides moderate improvement of lipids. Risk factors for coronary artery disease include dyslipidemia, hypertension and a sedentary lifestyle.      Review of Systems  Constitutional: Positive for irritability and malaise/fatigue.  Respiratory: Negative for shortness of  breath.   Musculoskeletal: Positive for arthritis and stiffness.  Psychiatric/Behavioral: The patient is nervous/anxious.   All other systems reviewed and are negative.      Objective:   Physical Exam Vitals reviewed.  Constitutional:      General: She is not in acute distress.    Appearance: She is well-developed.  HENT:     Head: Normocephalic and atraumatic.     Right Ear: Tympanic membrane normal.     Left Ear: Tympanic membrane normal.  Eyes:     Pupils: Pupils are equal, round, and reactive to light.  Neck:     Thyroid: No thyromegaly.  Cardiovascular:     Rate and Rhythm: Normal rate and regular rhythm.     Heart sounds: Normal heart sounds. No murmur heard.   Pulmonary:     Effort: Pulmonary effort is normal. No respiratory distress.     Breath sounds: Normal breath sounds. No wheezing.  Abdominal:     General: Bowel sounds are normal. There is no distension.     Palpations: Abdomen is soft.     Tenderness: There is no abdominal tenderness.  Musculoskeletal:        General: No tenderness. Normal range of motion.     Cervical back: Normal range of motion and neck supple.  Skin:    General: Skin is warm and dry.  Neurological:     Mental Status: She is alert and oriented to person, place, and time.     Cranial Nerves: No cranial nerve deficit.     Deep Tendon Reflexes: Reflexes are normal and symmetric.  Psychiatric:        Behavior: Behavior normal.        Thought Content:  Thought content normal.        Judgment: Judgment normal.       BP 122/89   Pulse 84   Temp (!) 96.6 F (35.9 C) (Temporal)   Ht 5' 1.5" (1.562 m)   Wt 170 lb 6.4 oz (77.3 kg)   BMI 31.68 kg/m      Assessment & Plan:  Manmeet Arzola Loughry comes in today with chief complaint of Hypertension (6 mth )   Diagnosis and orders addressed:  1. Anxiety Long discussion about xanax. We can no longer prescribe this medication. She does not want to tamper and wants to see [facility].   - ALPRAZolam (XANAX) 1 MG tablet; Take 1 tablet (1 mg total) by mouth 3 (three) times daily.  Dispense: 90 tablet; Refill: 2 - Ambulatory referral to Psychiatry - CMP14+EGFR - CBC with Differential/Platelet  2. Benzodiazepine dependence (HCC) - ToxASSURE Select 13 (MW), Urine - ALPRAZolam (XANAX) 1 MG tablet; Take 1 tablet (1 mg total) by mouth 3 (three) times daily.  Dispense: 90 tablet; Refill: 2 - Ambulatory referral to Psychiatry - CMP14+EGFR - CBC with Differential/Platelet  3. Controlled substance agreement signed - ToxASSURE Select 13 (MW), Urine - ALPRAZolam (XANAX) 1 MG tablet; Take 1 tablet (1 mg total) by mouth 3 (three) times daily.  Dispense: 90 tablet; Refill: 2 - CMP14+EGFR - CBC with Differential/Platelet  4. Essential hypertension - CMP14+EGFR - CBC with Differential/Platelet  5. Primary osteoarthritis involving multiple joints - CMP14+EGFR - CBC with Differential/Platelet  6. Obesity (BMI 30-39.9) - CMP14+EGFR - CBC with Differential/Platelet  7. Hypercholesteremia - CMP14+EGFR - CBC with Differential/Platelet  Patient reviewed in [place] controlled database, no flags noted. Contract and drug screen are up to date. Will no longer be able to prescribe xanax.  Labs pending Health Maintenance reviewed Diet and exercise encouraged  Follow up plan: 6 months   Evelina Dun, FNP

## 2019-10-31 NOTE — Patient Instructions (Addendum)
Your provider wants you to schedule an appointment with a Psychologist/Psychiatrist. The following list of offices requires the patient to call and make their own appointment, as there is information they need that only you can provide. Please feel free to choose form the following providers:  [facility] in [place]  [facility]  [phone] [place], [place]  (Scheduled through [facility]) Must call and do an interview for appointment. Sees Children / Accepts Medicaid  [facility] in [place]  [address], [place]    [place], [place]  [phone] [place], [place] for Autism but does not treat it Sees Children / Accepts Medicaid  [facility]    [phone] [address], [place], [place] Medication management, substance abuse, bipolar, grief, family, marriage, OCD, anxiety, PTSD Sees children / Accepts Medicaid  [facility]    [phone] [address] [place], [place] children / Accepts [facility]  [facility]  [phone] [address] [place], [place]   Dr Lorenza Evangelist     [phone] [address] [place], [place]  Sees ADD & ADHD for treatment Accepts Medicaid  [facility]  [phone] [address] Arlean Hopping, [place] for Autism Accepts [facility]  [facility]  [phone] [place], [place]  Does Adult ADD evaluations Does not accept Medicaid  [facility]   [phone] [place], [place] children as young as 61 years old Accepts [facility]     [phone]    [place], [place] [phone] Sees children Accepts Medicaid   Generalized Anxiety Disorder, Adult Generalized  anxiety disorder (GAD) is a mental health disorder. People with this condition constantly worry about everyday events. Unlike normal anxiety, worry related to GAD is not triggered by a specific event. These worries also do not fade or get better with time. GAD interferes with life functions, including relationships, work, and school. GAD can vary from mild to severe. People with severe GAD can have intense waves of anxiety with physical symptoms (panic attacks). What are the causes? The exact cause of GAD is not known. What increases the risk? This condition is more likely to develop in:  Women.  People who have a family history of anxiety disorders.  People who are very shy.  People who experience very stressful life events, such as the death of a loved one.  People who have a very stressful family environment. What are the signs or symptoms? People with GAD often worry excessively about many things in their lives, such as their health and family. They may also be overly concerned about:  Doing well at work.  Being on time.  Natural disasters.  Friendships. Physical symptoms of GAD include:  Fatigue.  Muscle tension or having muscle twitches.  Trembling or feeling shaky.  Being easily startled.  Feeling like your heart is pounding or racing.  Feeling out of breath or like you cannot take a deep breath.  Having trouble falling asleep or staying asleep.  Sweating.  Nausea, diarrhea, or irritable bowel syndrome (IBS).  Headaches.  Trouble concentrating or remembering facts.  Restlessness.  Irritability. How is this diagnosed? Your health care provider can diagnose  GAD based on your symptoms and medical history. You will also have a physical exam. The health care provider will ask specific questions about your symptoms, including how severe they are, when they started, and if they come and go. Your health care provider may ask you about your use of alcohol or drugs,  including prescription medicines. Your health care provider may refer you to a mental health specialist for further evaluation. Your health care provider will do a thorough examination and may perform additional tests to rule out other possible causes of your symptoms. To be diagnosed with GAD, a person must have anxiety that:  Is out of his or her control.  Affects several different aspects of his or her life, such as work and relationships.  Causes distress that makes him or her unable to take part in normal activities.  Includes at least three physical symptoms of GAD, such as restlessness, fatigue, trouble concentrating, irritability, muscle tension, or sleep problems. Before your health care provider can confirm a diagnosis of GAD, these symptoms must be present more days than they are not, and they must last for six months or longer. How is this treated? The following therapies are usually used to treat GAD:  Medicine. Antidepressant medicine is usually prescribed for long-term daily control. Antianxiety medicines may be added in severe cases, especially when panic attacks occur.  Talk therapy (psychotherapy). Certain types of talk therapy can be helpful in treating GAD by providing support, education, and guidance. Options include: ? Cognitive behavioral therapy (CBT). People learn coping skills and techniques to ease their anxiety. They learn to identify unrealistic or negative thoughts and behaviors and to replace them with positive ones. ? Acceptance and commitment therapy (ACT). This treatment teaches people how to be mindful as a way to cope with unwanted thoughts and feelings. ? Biofeedback. This process trains you to manage your body's response (physiological response) through breathing techniques and relaxation methods. You will work with a therapist while machines are used to monitor your physical symptoms.  Stress management techniques. These include yoga, meditation, and  exercise. A mental health specialist can help determine which treatment is best for you. Some people see improvement with one type of therapy. However, other people require a combination of therapies. Follow these instructions at home:  Take over-the-counter and prescription medicines only as told by your health care provider.  Try to maintain a normal routine.  Try to anticipate stressful situations and allow extra time to manage them.  Practice any stress management or self-calming techniques as taught by your health care provider.  Do not punish yourself for setbacks or for not making progress.  Try to recognize your accomplishments, even if they are small.  Keep all follow-up visits as told by your health care provider. This is important. Contact a health care provider if:  Your symptoms do not get better.  Your symptoms get worse.  You have signs of depression, such as: ? A persistently sad, cranky, or irritable mood. ? Loss of enjoyment in activities that used to bring you joy. ? Change in weight or eating. ? Changes in sleeping habits. ? Avoiding friends or family members. ? Loss of energy for normal tasks. ? Feelings of guilt or worthlessness. Get help right away if:  You have serious thoughts about hurting yourself or others. If you ever feel like you may hurt yourself or others, or have thoughts about taking your own life, get help right away. You can go to your nearest  emergency department or call:  Your local emergency services (911 in the [place]  A suicide crisis helpline, such as the [place] at (463)695-0540. This is open 24 hours a day. Summary  Generalized anxiety disorder (GAD) is a mental health disorder that involves worry that is not triggered by a specific event.  People with GAD often worry excessively about many things in their lives, such as their health and family.  GAD may cause physical symptoms such as  restlessness, trouble concentrating, sleep problems, frequent sweating, nausea, diarrhea, headaches, and trembling or muscle twitching.  A mental health specialist can help determine which treatment is best for you. Some people see improvement with one type of therapy. However, other people require a combination of therapies. This information is not intended to replace advice given to you by your health care provider. Make sure you discuss any questions you have with your health care provider. Document Revised: 03/24/2017 Document Reviewed: 03/01/2016 Elsevier Patient Education  2020 [employer].

## 2019-11-01 LAB — CBC WITH DIFFERENTIAL/PLATELET
Basophils Absolute: 0 10*3/uL (ref 0.0–0.2)
Basos: 0 %
EOS (ABSOLUTE): 0.1 10*3/uL (ref 0.0–0.4)
Eos: 1 %
Hematocrit: 40.3 % (ref 34.0–46.6)
Hemoglobin: 13.9 g/dL (ref 11.1–15.9)
Immature Grans (Abs): 0 10*3/uL (ref 0.0–0.1)
Immature Granulocytes: 1 %
Lymphocytes Absolute: 2 10*3/uL (ref 0.7–3.1)
Lymphs: 25 %
MCH: 31.7 pg (ref 26.6–33.0)
MCHC: 34.5 g/dL (ref 31.5–35.7)
MCV: 92 fL (ref 79–97)
Monocytes Absolute: 0.6 10*3/uL (ref 0.1–0.9)
Monocytes: 7 %
Neutrophils Absolute: 5.2 10*3/uL (ref 1.4–7.0)
Neutrophils: 66 %
Platelets: 235 10*3/uL (ref 150–450)
RBC: 4.38 x10E6/uL (ref 3.77–5.28)
RDW: 13.3 % (ref 11.7–15.4)
WBC: 7.9 10*3/uL (ref 3.4–10.8)

## 2019-11-01 LAB — CMP14+EGFR
ALT: 28 IU/L (ref 0–32)
AST: 25 IU/L (ref 0–40)
Albumin/Globulin Ratio: 2.1 (ref 1.2–2.2)
Albumin: 4.5 g/dL (ref 3.8–4.8)
Alkaline Phosphatase: 77 IU/L (ref 48–121)
BUN/Creatinine Ratio: 22 (ref 12–28)
BUN: 15 mg/dL (ref 8–27)
Bilirubin Total: 0.7 mg/dL (ref 0.0–1.2)
CO2: 22 mmol/L (ref 20–29)
Calcium: 9.4 mg/dL (ref 8.7–10.3)
Chloride: 104 mmol/L (ref 96–106)
Creatinine, Ser: 0.67 mg/dL (ref 0.57–1.00)
GFR calc Af Amer: 106 mL/min/{1.73_m2} (ref >59)
GFR calc non Af Amer: 92 mL/min/{1.73_m2} (ref >59)
Globulin, Total: 2.1 g/dL (ref 1.5–4.5)
Glucose: 81 mg/dL (ref 65–99)
Potassium: 4.2 mmol/L (ref 3.5–5.2)
Sodium: 143 mmol/L (ref 134–144)
Total Protein: 6.6 g/dL (ref 6.0–8.5)

## 2019-11-06 LAB — TOXASSURE SELECT 13 (MW), URINE

## 2019-11-14 ENCOUNTER — Other Ambulatory Visit: Payer: Self-pay | Admitting: Family

## 2019-11-14 DIAGNOSIS — M159 Polyosteoarthritis, unspecified: Secondary | ICD-10-CM

## 2019-11-26 ENCOUNTER — Other Ambulatory Visit: Payer: Medicare HMO

## 2019-11-26 ENCOUNTER — Other Ambulatory Visit: Payer: Self-pay

## 2019-11-26 DIAGNOSIS — L409 Psoriasis, unspecified: Secondary | ICD-10-CM | POA: Diagnosis not present

## 2019-11-26 DIAGNOSIS — Z79899 Other long term (current) drug therapy: Secondary | ICD-10-CM | POA: Diagnosis not present

## 2019-12-10 ENCOUNTER — Other Ambulatory Visit: Payer: Self-pay | Admitting: Family

## 2019-12-16 DIAGNOSIS — L249 Irritant contact dermatitis, unspecified cause: Secondary | ICD-10-CM | POA: Diagnosis not present

## 2019-12-16 DIAGNOSIS — L400 Psoriasis vulgaris: Secondary | ICD-10-CM | POA: Diagnosis not present

## 2020-01-14 ENCOUNTER — Other Ambulatory Visit: Payer: Self-pay | Admitting: Family

## 2020-01-14 DIAGNOSIS — E7800 Pure hypercholesterolemia, unspecified: Secondary | ICD-10-CM

## 2020-01-16 ENCOUNTER — Ambulatory Visit (INDEPENDENT_AMBULATORY_CARE_PROVIDER_SITE_OTHER): Payer: Medicare HMO | Admitting: Family

## 2020-01-16 ENCOUNTER — Encounter: Payer: Self-pay | Admitting: Family

## 2020-01-16 ENCOUNTER — Other Ambulatory Visit: Payer: Self-pay

## 2020-01-16 VITALS — BP 137/86 | HR 93 | Temp 96.0°F | Ht 61.6 in | Wt 176.6 lb

## 2020-01-16 DIAGNOSIS — I10 Essential (primary) hypertension: Secondary | ICD-10-CM | POA: Diagnosis not present

## 2020-01-16 DIAGNOSIS — M159 Polyosteoarthritis, unspecified: Secondary | ICD-10-CM

## 2020-01-16 DIAGNOSIS — M8949 Other hypertrophic osteoarthropathy, multiple sites: Secondary | ICD-10-CM | POA: Diagnosis not present

## 2020-01-16 DIAGNOSIS — F419 Anxiety disorder, unspecified: Secondary | ICD-10-CM | POA: Diagnosis not present

## 2020-01-16 DIAGNOSIS — Z23 Encounter for immunization: Secondary | ICD-10-CM

## 2020-01-16 DIAGNOSIS — E669 Obesity, unspecified: Secondary | ICD-10-CM | POA: Diagnosis not present

## 2020-01-16 DIAGNOSIS — Z79899 Other long term (current) drug therapy: Secondary | ICD-10-CM | POA: Diagnosis not present

## 2020-01-16 DIAGNOSIS — L659 Nonscarring hair loss, unspecified: Secondary | ICD-10-CM

## 2020-01-16 DIAGNOSIS — E7800 Pure hypercholesterolemia, unspecified: Secondary | ICD-10-CM

## 2020-01-16 DIAGNOSIS — F1320 Sedative, hypnotic or anxiolytic dependence, uncomplicated: Secondary | ICD-10-CM | POA: Diagnosis not present

## 2020-01-16 MED ORDER — ALPRAZOLAM 1 MG PO TABS: ORAL_TABLET | ORAL | 0 refills | Status: AC

## 2020-01-16 NOTE — Progress Notes (Signed)
Subjective:    Patient ID: Joyce [name], female    DOB: 25-Aug-1955, 64 y.o.   MRN: [id number]  Chief Complaint  Patient presents with  . Medical Management of Chronic Issues    not fasting, states she is losing hair stress cause you are trying to take her of xanax  . Hypertension   PT presents to the office today for chronic follow up. She reports she could not take the Buspar or the Lexapro because it made her sick. Pt states she did not follow up with [facility] and has continued taking her Xanax 1 mg TID. She states she needs this and can not stop this. She states her hair is falling out because of her stress of losing her xanax. Pt states she needs to see another provider since I will no longer be giving this to her.  Hypertension This is a chronic problem. The current episode started more than 1 year ago. The problem has been resolved since onset. The problem is controlled. Associated symptoms include anxiety. Pertinent negatives include no malaise/fatigue, peripheral edema or shortness of breath. Risk factors for coronary artery disease include diabetes mellitus. The current treatment provides moderate improvement. There is no history of kidney disease, CVA or heart failure.  Arthritis Presents for follow-up visit. She complains of pain, stiffness and joint swelling. The symptoms have been stable. Affected locations include the right MCP and left MCP. Her pain is at a severity of 9/10.  Hyperlipidemia This is a chronic problem. The problem is controlled. Recent lipid tests were reviewed and are normal. Pertinent negatives include no shortness of breath. Current antihyperlipidemic treatment includes statins. The current treatment provides moderate improvement of lipids. Risk factors for coronary artery disease include hypertension, a sedentary lifestyle, post-menopausal and dyslipidemia.  Anxiety Presents for follow-up visit. Symptoms include excessive worry, irritability,  nervous/anxious behavior and restlessness. Patient reports no shortness of breath. Symptoms occur most days. The severity of symptoms is moderate.        Review of Systems  Constitutional: Positive for irritability. Negative for malaise/fatigue.  Respiratory: Negative for shortness of breath.   Musculoskeletal: Positive for arthritis, joint swelling and stiffness.  Psychiatric/Behavioral: The patient is nervous/anxious.   All other systems reviewed and are negative.      Objective:   Physical Exam Vitals reviewed.  Constitutional:      General: She is not in acute distress.    Appearance: She is well-developed.  HENT:     Head: Normocephalic and atraumatic.     Right Ear: Tympanic membrane normal.     Left Ear: Tympanic membrane normal.  Eyes:     Pupils: Pupils are equal, round, and reactive to light.  Neck:     Thyroid: No thyromegaly.  Cardiovascular:     Rate and Rhythm: Normal rate and regular rhythm.     Heart sounds: Normal heart sounds. No murmur heard.   Pulmonary:     Effort: Pulmonary effort is normal. No respiratory distress.     Breath sounds: Normal breath sounds. No wheezing.  Abdominal:     General: Bowel sounds are normal. There is no distension.     Palpations: Abdomen is soft.     Tenderness: There is no abdominal tenderness.  Musculoskeletal:        General: No tenderness. Normal range of motion.     Cervical back: Normal range of motion and neck supple.  Skin:    General: Skin is warm and dry.  Neurological:  Mental Status: She is alert and oriented to person, place, and time.     Cranial Nerves: No cranial nerve deficit.     Deep Tendon Reflexes: Reflexes are normal and symmetric.  Psychiatric:        Behavior: Behavior normal.        Thought Content: Thought content normal.        Judgment: Judgment normal.       BP 137/86   Pulse 93   Temp (!) 96 F (35.6 C) (Temporal)   Ht 5' 1.6" (1.565 m)   Wt 176 lb 9.6 oz (80.1 kg)    SpO2 97%   BMI 32.72 kg/m      Assessment & Plan:  Joyce [name] comes in today with chief complaint of Medical Management of Chronic Issues (not fasting, states she is losing hair stress cause you are trying to take her of xanax) and Hypertension   Diagnosis and orders addressed:  1. Anxiety Pt very upset today, because we will not continue current dose of xanax. We spent a long discussion on this on her last visit. She never followed up with [facility] and did not tamper down. She has continued to taking 1 mg of xanax three times a day even after I had told her I can not continue prescribing this medication. I will given a ONE time dose of Xanax 1 mg BID for 30 days, then 1 mg daily for 30 days. This is my last controlled medication I will prescribe for this patient. She states she is following up with a new PCP. I agree with this decision.  - ALPRAZolam (XANAX) 1 MG tablet; Take 1 tablet (1 mg total) by mouth 2 (two) times daily as needed for 30 days for anxiety, THEN 1 tablet (1 mg total) at bedtime.  Dispense: 90 tablet; Refill: 0 - CMP14+EGFR - CBC with Differential/Platelet  2. Benzodiazepine dependence (HCC) - ALPRAZolam (XANAX) 1 MG tablet; Take 1 tablet (1 mg total) by mouth 2 (two) times daily as needed for 30 days for anxiety, THEN 1 tablet (1 mg total) at bedtime.  Dispense: 90 tablet; Refill: 0 - CMP14+EGFR - CBC with Differential/Platelet  3. Controlled substance agreement signed - ALPRAZolam (XANAX) 1 MG tablet; Take 1 tablet (1 mg total) by mouth 2 (two) times daily as needed for 30 days for anxiety, THEN 1 tablet (1 mg total) at bedtime.  Dispense: 90 tablet; Refill: 0 - CMP14+EGFR - CBC with Differential/Platelet  4. Need for immunization against influenza - Flu Vaccine QUAD High Dose(Fluad) - CMP14+EGFR - CBC with Differential/Platelet  5. Essential hypertension - CMP14+EGFR - CBC with Differential/Platelet  6. Primary osteoarthritis involving multiple  joints - CMP14+EGFR - CBC with Differential/Platelet  7. Obesity (BMI 30-39.9) - CMP14+EGFR - CBC with Differential/Platelet  8. Hypercholesteremia - CMP14+EGFR - CBC with Differential/Platelet  9. Hair loss - CMP14+EGFR - CBC with Differential/Platelet - TSH   Labs pending Health Maintenance reviewed Diet and exercise encouraged  Follow up plan: With new PCP, would recommend tampering/dc xanax. See above.     Evelina Dun, FNP

## 2020-01-16 NOTE — Patient Instructions (Signed)

## 2020-01-17 LAB — CBC WITH DIFFERENTIAL/PLATELET
Basophils Absolute: 0 10*3/uL (ref 0.0–0.2)
Basos: 0 %
EOS (ABSOLUTE): 0.2 10*3/uL (ref 0.0–0.4)
Eos: 3 %
Hematocrit: 42.9 % (ref 34.0–46.6)
Hemoglobin: 14.2 g/dL (ref 11.1–15.9)
Immature Grans (Abs): 0 10*3/uL (ref 0.0–0.1)
Immature Granulocytes: 0 %
Lymphocytes Absolute: 1.3 10*3/uL (ref 0.7–3.1)
Lymphs: 25 %
MCH: 30.7 pg (ref 26.6–33.0)
MCHC: 33.1 g/dL (ref 31.5–35.7)
MCV: 93 fL (ref 79–97)
Monocytes Absolute: 0.5 10*3/uL (ref 0.1–0.9)
Monocytes: 9 %
Neutrophils Absolute: 3.4 10*3/uL (ref 1.4–7.0)
Neutrophils: 63 %
Platelets: 251 10*3/uL (ref 150–450)
RBC: 4.63 x10E6/uL (ref 3.77–5.28)
RDW: 12.4 % (ref 11.7–15.4)
WBC: 5.4 10*3/uL (ref 3.4–10.8)

## 2020-01-17 LAB — CMP14+EGFR
ALT: 32 IU/L (ref 0–32)
AST: 34 IU/L (ref 0–40)
Albumin/Globulin Ratio: 2.2 (ref 1.2–2.2)
Albumin: 4.3 g/dL (ref 3.8–4.8)
Alkaline Phosphatase: 80 IU/L (ref 44–121)
BUN/Creatinine Ratio: 14 (ref 12–28)
BUN: 11 mg/dL (ref 8–27)
Bilirubin Total: 0.5 mg/dL (ref 0.0–1.2)
CO2: 24 mmol/L (ref 20–29)
Calcium: 9.4 mg/dL (ref 8.7–10.3)
Chloride: 103 mmol/L (ref 96–106)
Creatinine, Ser: 0.76 mg/dL (ref 0.57–1.00)
GFR calc Af Amer: 95 mL/min/{1.73_m2} (ref >59)
GFR calc non Af Amer: 82 mL/min/{1.73_m2} (ref >59)
Globulin, Total: 2 g/dL (ref 1.5–4.5)
Glucose: 105 mg/dL — ABNORMAL HIGH (ref 65–99)
Potassium: 4.3 mmol/L (ref 3.5–5.2)
Sodium: 142 mmol/L (ref 134–144)
Total Protein: 6.3 g/dL (ref 6.0–8.5)

## 2020-01-17 LAB — TSH: TSH: 1.64 u[IU]/mL (ref 0.450–4.500)

## 2020-01-30 DIAGNOSIS — M549 Dorsalgia, unspecified: Secondary | ICD-10-CM | POA: Diagnosis not present

## 2020-01-30 DIAGNOSIS — F419 Anxiety disorder, unspecified: Secondary | ICD-10-CM | POA: Diagnosis not present

## 2020-01-30 DIAGNOSIS — E785 Hyperlipidemia, unspecified: Secondary | ICD-10-CM | POA: Diagnosis not present

## 2020-03-23 ENCOUNTER — Other Ambulatory Visit (HOSPITAL_COMMUNITY): Payer: Self-pay | Admitting: Obstetrics & Gynecology

## 2020-03-23 DIAGNOSIS — Z1231 Encounter for screening mammogram for malignant neoplasm of breast: Secondary | ICD-10-CM

## 2020-04-03 ENCOUNTER — Ambulatory Visit: Payer: Medicare Other | Admitting: Obstetrics & Gynecology

## 2020-04-09 DIAGNOSIS — L400 Psoriasis vulgaris: Secondary | ICD-10-CM | POA: Diagnosis not present

## 2020-04-29 DIAGNOSIS — E785 Hyperlipidemia, unspecified: Secondary | ICD-10-CM | POA: Diagnosis not present

## 2020-04-29 DIAGNOSIS — M549 Dorsalgia, unspecified: Secondary | ICD-10-CM | POA: Diagnosis not present

## 2020-04-29 DIAGNOSIS — F419 Anxiety disorder, unspecified: Secondary | ICD-10-CM | POA: Diagnosis not present

## 2020-05-05 ENCOUNTER — Ambulatory Visit: Payer: Medicare Other | Admitting: Family

## 2020-05-22 ENCOUNTER — Ambulatory Visit (HOSPITAL_COMMUNITY)
Admission: RE | Admit: 2020-05-22 | Discharge: 2020-05-22 | Disposition: A | Discharge status: Home / Self Care | Payer: Medicare HMO | Source: Ambulatory Visit | Attending: Obstetrics & Gynecology | Admitting: Obstetrics & Gynecology

## 2020-05-22 ENCOUNTER — Other Ambulatory Visit: Payer: Medicare HMO | Admitting: Obstetrics & Gynecology

## 2020-05-22 ENCOUNTER — Other Ambulatory Visit: Payer: Self-pay

## 2020-05-22 DIAGNOSIS — Z1231 Encounter for screening mammogram for malignant neoplasm of breast: Secondary | ICD-10-CM | POA: Diagnosis not present

## 2020-06-05 ENCOUNTER — Other Ambulatory Visit: Payer: Self-pay

## 2020-06-05 ENCOUNTER — Ambulatory Visit (INDEPENDENT_AMBULATORY_CARE_PROVIDER_SITE_OTHER): Payer: Medicare HMO | Admitting: Obstetrics & Gynecology

## 2020-06-05 ENCOUNTER — Encounter: Payer: Self-pay | Admitting: Obstetrics & Gynecology

## 2020-06-05 VITALS — BP 147/92 | HR 69 | Ht 62.0 in | Wt 174.0 lb

## 2020-06-05 DIAGNOSIS — Z01419 Encounter for gynecological examination (general) (routine) without abnormal findings: Secondary | ICD-10-CM

## 2020-06-05 DIAGNOSIS — Z1212 Encounter for screening for malignant neoplasm of rectum: Secondary | ICD-10-CM | POA: Diagnosis not present

## 2020-06-05 DIAGNOSIS — Z1211 Encounter for screening for malignant neoplasm of colon: Secondary | ICD-10-CM | POA: Diagnosis not present

## 2020-06-05 NOTE — Progress Notes (Signed)
Subjective:     Joyce [name] is a 65 y.o. female here for a routine exam.  No LMP recorded. Patient has had a hysterectomy. G1P1001 Birth Control Method:  hysterectomy Menstrual Calendar(currently): amenorrheic  Current complaints: none.   Current acute medical issues:  RA   Recent Gynecologic History No LMP recorded. Patient has had a hysterectomy. Last Pap: 2019,  normal Last mammogram: 2022,  normal  Past Medical History:  Diagnosis Date  . Anxiety   . Arthritis   . Cancer ([place])   . Headache(784.0)   . Hypercholesteremia   . Psoriasis   . Vaginal Pap smear, abnormal    cervical cancer    Past Surgical History:  Procedure Laterality Date  . ABDOMINAL HYSTERECTOMY    . COLONOSCOPY  04/05/2012   Procedure: COLONOSCOPY;  Surgeon: Rogene Houston, MD;  [facility];  Service: Endoscopy;  Laterality: N/A;  930  . COLONOSCOPY N/A 08/17/2016   Procedure: COLONOSCOPY;  Surgeon: Danie Binder, MD;  [facility];  Service: Endoscopy;  Laterality: N/A;  10:00am  . HEMORRHOID BANDING N/A 08/17/2016   Procedure: HEMORRHOID BANDING;  Surgeon: Danie Binder, MD;  [facility];  Service: Endoscopy;  Laterality: N/A;  . INCISION AND DRAINAGE Right 08/01/2015   Procedure: INCISION AND DRAINAGE RIGHT RING FINGER;  Surgeon: Carole Civil, MD;  Location: [facility];  Service: Orthopedics;  Laterality: Right;    OB History    Gravida  1   Para  1   Term  1   Preterm      AB      Living  1     SAB      IAB      Ectopic      Multiple      Live Births  1           Social History   Socioeconomic History  . Marital status: Single    Spouse name: Not on file  . Number of children: 1  . Years of education: 2  . Highest education level: Not on file  Occupational History    Employer: [employer]  Tobacco Use  . Smoking status: Former Smoker    Packs/day: 1.00    Years: 44.00    Pack years: 44.00    Types: Cigarettes  . Smokeless  tobacco: Never Used  Vaping Use  . Vaping Use: Never used  Substance and Sexual Activity  . Alcohol use: No    Alcohol/week: 0.0 standard drinks  . Drug use: No  . Sexual activity: Yes    Birth control/protection: Surgical    Comment: hyst  Other Topics Concern  . Not on file  Social History Narrative   Lives alone most of the time.   Social Determinants of Health   Financial Resource Strain: Low Risk   . Difficulty of Paying Living Expenses: Not hard at all  Food Insecurity: No Food Insecurity  . Worried About Charity fundraiser in the Last Year: Never true  . Ran Out of Food in the Last Year: Never true  Transportation Needs: No Transportation Needs  . Lack of Transportation (Medical): No  . Lack of Transportation (Non-Medical): No  Physical Activity: Insufficiently Active  . Days of Exercise per Week: 4 days  . Minutes of Exercise per Session: 20 min  Stress: Stress Concern Present  . Feeling of Stress : To some extent  Social Connections: Moderately Isolated  .  Frequency of Communication with Friends and Family: More than three times a week  . Frequency of Social Gatherings with Friends and Family: More than three times a week  . Attends Religious Services: 1 to 4 times per year  . Active Member of Clubs or Organizations: No  . Attends Archivist Meetings: Never  . Marital Status: Never married    Family History  Problem Relation Age of Onset  . Ovarian cancer Mother   . Bladder Cancer Father   . Lung cancer Sister   . Cancer Brother        liver  . Cancer Other   . Colon cancer Neg Hx      Current Outpatient Medications:  .  acetaminophen (TYLENOL) 500 MG tablet, Take 1,000 mg by mouth daily as needed for headache., Disp: , Rfl:  .  ALPRAZolam (XANAX) 1 MG tablet, TAKE  (1)  TABLET  THREElTIMES DAILY AS NEEDED., Disp: , Rfl:  .  augmented betamethasone dipropionate (DIPROLENE-AF) 0.05 % ointment, Apply 1 application topically. Twice a week, Disp:  , Rfl: 3 .  celecoxib (CELEBREX) 200 MG capsule, TAKE (1) CAPSULE DAILY EVENING, Disp: 90 capsule, Rfl: 0 .  ezetimibe (ZETIA) 10 MG tablet, TAKE 1 TABLET DAILY, Disp: 90 tablet, Rfl: 1 .  folic acid (FOLVITE) 1 MG tablet, Take 1 mg by mouth as directed., Disp: , Rfl:  .  hydrocortisone 2.5 % cream, Apply topically 2 (two) times daily as needed., Disp: , Rfl:  .  hydrOXYzine (VISTARIL) 25 MG capsule, Take 1 capsule (25 mg total) by mouth 3 (three) times daily as needed., Disp: 90 capsule, Rfl: 1 .  methotrexate (RHEUMATREX) 2.5 MG tablet, Take 3 tablets by mouth once a week., Disp: , Rfl:  .  rosuvastatin (CRESTOR) 20 MG tablet, TAKE 1 TABLET IN THE EVENING, Disp: 90 tablet, Rfl: 0  Review of Systems  Review of Systems  Constitutional: Negative for fever, chills, weight loss, malaise/fatigue and diaphoresis.  HENT: Negative for hearing loss, ear pain, nosebleeds, congestion, sore throat, neck pain, tinnitus and ear discharge.   Eyes: Negative for blurred vision, double vision, photophobia, pain, discharge and redness.  Respiratory: Negative for cough, hemoptysis, sputum production, shortness of breath, wheezing and stridor.   Cardiovascular: Negative for chest pain, palpitations, orthopnea, claudication, leg swelling and PND.  Gastrointestinal: negative for abdominal pain. Negative for heartburn, nausea, vomiting, diarrhea, constipation, blood in stool and melena.  Genitourinary: Negative for dysuria, urgency, frequency, hematuria and flank pain.  Musculoskeletal: Negative for myalgias, back pain, joint pain and falls.  Skin: Negative for itching and rash.  Neurological: Negative for dizziness, tingling, tremors, sensory change, speech change, focal weakness, seizures, loss of consciousness, weakness and headaches.  Endo/Heme/Allergies: Negative for environmental allergies and polydipsia. Does not bruise/bleed easily.  Psychiatric/Behavioral: Negative for depression, suicidal ideas,  hallucinations, memory loss and substance abuse. The patient is not nervous/anxious and does not have insomnia.        Objective:  Blood pressure (!) 147/92, pulse 69, height 5\' 2"  (1.575 m), weight 174 lb (78.9 kg).   Physical Exam  Vitals reviewed. Constitutional: She is oriented to person, place, and time. She appears well-developed and well-nourished.  HENT:  Head: Normocephalic and atraumatic.        Right Ear: External ear normal.  Left Ear: External ear normal.  Nose: Nose normal.  Mouth/Throat: Oropharynx is clear and moist.  Eyes: Conjunctivae and EOM are normal. Pupils are equal, round, and reactive to light. Right  eye exhibits no discharge. Left eye exhibits no discharge. No scleral icterus.  Neck: Normal range of motion. Neck supple. No tracheal deviation present. No thyromegaly present.  Cardiovascular: Normal rate, regular rhythm, normal heart sounds and intact distal pulses.  Exam reveals no gallop and no friction rub.   No murmur heard. Respiratory: Effort normal and breath sounds normal. No respiratory distress. She has no wheezes. She has no rales. She exhibits no tenderness.  GI: Soft. Bowel sounds are normal. She exhibits no distension and no mass. There is no tenderness. There is no rebound and no guarding.  Genitourinary:  Breasts no masses skin changes or nipple changes bilaterally      Vulva is normal without lesions Vagina is pink moist without discharge Cervix absent Uterus is absent Adnexa is negative  {Rectal    hemoccult negative, normal tone, no masses  Musculoskeletal: Normal range of motion. She exhibits no edema and no tenderness.  Neurological: She is alert and oriented to person, place, and time. She has normal reflexes. She displays normal reflexes. No cranial nerve deficit. She exhibits normal muscle tone. Coordination normal.  Skin: Skin is warm and dry. No rash noted. No erythema. No pallor.  Psychiatric: She has a normal mood and affect. Her  behavior is normal. Judgment and thought content normal.       Medications Ordered at today's visit: No orders of the defined types were placed in this encounter.   Other orders placed at today's visit: No orders of the defined types were placed in this encounter.     Assessment:    Normal Gyn exam.    Plan:    Contraception: status post hysterectomy. Mammogram ordered. Follow up in: 3 years.     Return in about 3 years (around 06/06/2023) for yearly, with Dr Elonda Husky.

## 2020-06-18 DIAGNOSIS — J329 Chronic sinusitis, unspecified: Secondary | ICD-10-CM | POA: Diagnosis not present

## 2020-06-18 DIAGNOSIS — R059 Cough, unspecified: Secondary | ICD-10-CM | POA: Diagnosis not present

## 2020-06-18 DIAGNOSIS — R0981 Nasal congestion: Secondary | ICD-10-CM | POA: Diagnosis not present

## 2020-06-18 DIAGNOSIS — J029 Acute pharyngitis, unspecified: Secondary | ICD-10-CM | POA: Diagnosis not present

## 2020-06-18 DIAGNOSIS — R0602 Shortness of breath: Secondary | ICD-10-CM | POA: Diagnosis not present

## 2020-07-28 DIAGNOSIS — J329 Chronic sinusitis, unspecified: Secondary | ICD-10-CM | POA: Diagnosis not present

## 2020-07-28 DIAGNOSIS — R059 Cough, unspecified: Secondary | ICD-10-CM | POA: Diagnosis not present

## 2020-08-19 DIAGNOSIS — M549 Dorsalgia, unspecified: Secondary | ICD-10-CM | POA: Diagnosis not present

## 2020-08-19 DIAGNOSIS — E785 Hyperlipidemia, unspecified: Secondary | ICD-10-CM | POA: Diagnosis not present

## 2020-08-19 DIAGNOSIS — F419 Anxiety disorder, unspecified: Secondary | ICD-10-CM | POA: Diagnosis not present

## 2020-10-08 DIAGNOSIS — L400 Psoriasis vulgaris: Secondary | ICD-10-CM | POA: Diagnosis not present

## 2021-12-14 ENCOUNTER — Encounter: Payer: Self-pay | Admitting: Gastroenterology

## 2021-12-14 NOTE — Progress Notes (Unsigned)
Primary Care Physician:  Sharion Balloon, FNP Primary Gastroenterologist:  Dr. Abbey Chatters  No chief complaint on file.   HPI:   Joyce [name] is a 66 y.o. female presenting today with chief complaint of dysphagia ***   Colonoscopy April 2018 with 3 mm hyperplastic polyp removed, internal hemorrhoids s/p banding x3. Recommended 10-year repeat.  Past Medical History:  Diagnosis Date   Anxiety    Arthritis    Cancer ([place])    Headache(784.0)    Hypercholesteremia    Psoriasis    Vaginal Pap smear, abnormal    cervical cancer    Past Surgical History:  Procedure Laterality Date   ABDOMINAL HYSTERECTOMY     COLONOSCOPY  04/05/2012   Procedure: COLONOSCOPY;  Surgeon: Rogene Houston, MD;  [facility];  Service: Endoscopy;  Laterality: N/A;  930   COLONOSCOPY N/A 08/17/2016   Procedure: COLONOSCOPY;  Surgeon: Danie Binder, MD;  [facility];  Service: Endoscopy;  Laterality: N/A;  10:00am   HEMORRHOID BANDING N/A 08/17/2016   Procedure: HEMORRHOID BANDING;  Surgeon: Danie Binder, MD;  [facility];  Service: Endoscopy;  Laterality: N/A;   INCISION AND DRAINAGE Right 08/01/2015   Procedure: INCISION AND DRAINAGE RIGHT RING FINGER;  Surgeon: Carole Civil, MD;  Location: [facility];  Service: Orthopedics;  Laterality: Right;    Current Outpatient Medications  Medication Sig Dispense Refill   acetaminophen (TYLENOL) 500 MG tablet Take 1,000 mg by mouth daily as needed for headache.     ALPRAZolam (XANAX) 1 MG tablet TAKE  (1)  TABLET  THREElTIMES DAILY AS NEEDED.     augmented betamethasone dipropionate (DIPROLENE-AF) 0.05 % ointment Apply 1 application topically. Twice a week  3   celecoxib (CELEBREX) 200 MG capsule TAKE (1) CAPSULE DAILY EVENING 90 capsule 0   ezetimibe (ZETIA) 10 MG tablet TAKE 1 TABLET DAILY 90 tablet 1   folic acid (FOLVITE) 1 MG tablet Take 1 mg by mouth as directed.     hydrocortisone 2.5 % cream Apply topically 2 (two)  times daily as needed.     hydrOXYzine (VISTARIL) 25 MG capsule Take 1 capsule (25 mg total) by mouth 3 (three) times daily as needed. 90 capsule 1   methotrexate (RHEUMATREX) 2.5 MG tablet Take 3 tablets by mouth once a week.     rosuvastatin (CRESTOR) 20 MG tablet TAKE 1 TABLET IN THE EVENING 90 tablet 0   No current facility-administered medications for this visit.    Allergies as of 12/15/2021 - Review Complete 06/05/2020  Allergen Reaction Noted   Other Hives 08/12/2016    Family History  Problem Relation Age of Onset   Ovarian cancer Mother    Bladder Cancer Father    Lung cancer Sister    Cancer Brother        liver   Cancer Other    Colon cancer Neg Hx     Social History   Socioeconomic History   Marital status: Single    Spouse name: Not on file   Number of children: 1   Years of education: 11   Highest education level: Not on file  Occupational History    Employer: [employer]  Tobacco Use   Smoking status: Former    Packs/day: 1.00    Years: 44.00    Total pack years: 44.00    Types: Cigarettes   Smokeless tobacco: Never  Vaping Use   Vaping Use: Never used  Substance and Sexual Activity   Alcohol use: No    Alcohol/week: 0.0 standard drinks of alcohol   Drug use: No   Sexual activity: Yes    Birth control/protection: Surgical    Comment: hyst  Other Topics Concern   Not on file  Social History Narrative   Lives alone most of the time.   Social Determinants of Health   Financial Resource Strain: Low Risk  (06/05/2020)   Overall Financial Resource Strain (CARDIA)    Difficulty of Paying Living Expenses: Not hard at all  Food Insecurity: No Food Insecurity (06/05/2020)   Hunger Vital Sign    Worried About Running Out of Food in the Last Year: Never true    Ran Out of Food in the Last Year: Never true  Transportation Needs: No Transportation Needs (06/05/2020)   PRAPARE - Hydrologist (Medical): No    Lack of  Transportation (Non-Medical): No  Physical Activity: Insufficiently Active (06/05/2020)   Exercise Vital Sign    Days of Exercise per Week: 4 days    Minutes of Exercise per Session: 20 min  Stress: Stress Concern Present (06/05/2020)   [place]    Feeling of Stress : To some extent  Social Connections: Moderately Isolated (06/05/2020)   Social Connection and Isolation Panel [NHANES]    Frequency of Communication with Friends and Family: More than three times a week    Frequency of Social Gatherings with Friends and Family: More than three times a week    Attends Religious Services: 1 to 4 times per year    Active Member of [employer] or Organizations: No    Attends Archivist Meetings: Never    Marital Status: Never married  Intimate Partner Violence: Not At Risk (06/05/2020)   Humiliation, Afraid, Rape, and Kick questionnaire    Fear of Current or Ex-Partner: No    Emotionally Abused: No    Physically Abused: No    Sexually Abused: No    Review of Systems: Gen: Denies any fever, chills, cold or flulike symptoms, presyncope, syncope. CV: Denies chest pain, heart palpitations.  Resp: Denies shortness of breath, cough. GI: See HPI GU : Denies urinary burning, urinary frequency, urinary hesitancy MS: Denies joint pain. Derm: Denies rash. Psych: Denies depression, anxiety. Heme: See HPI  Physical Exam: There were no vitals taken for this visit. General:   Alert and oriented. Pleasant and cooperative. Well-nourished and well-developed.  Head:  Normocephalic and atraumatic. Eyes:  Without icterus, sclera clear and conjunctiva pink.  Ears:  Normal auditory acuity. Lungs:  Clear to auscultation bilaterally. No wheezes, rales, or rhonchi. No distress.  Heart:  S1, S2 present without murmurs appreciated.  Abdomen:  +BS, soft, non-tender and non-distended. No HSM noted. No guarding or rebound. No masses appreciated.   Rectal:  Deferred  Msk:  Symmetrical without gross deformities. Normal posture. Extremities:  Without edema. Neurologic:  Alert and  oriented x4;  grossly normal neurologically. Skin:  Intact without significant lesions or rashes. Psych:   Normal mood and affect.    Assessment:     Plan:  ***   Aliene Altes, PA-C [facility] 12/15/2021

## 2021-12-14 NOTE — H&P (View-Only) (Signed)
Primary Care Physician:  Adaline Sill, NP Primary Gastroenterologist:  Dr. Abbey Chatters  Chief Complaint  Patient presents with   Dysphagia    Having trouble swallowing. Getting choked     HPI:   Joyce [name] is a 66 y.o. female presenting today with chief complaint of dysphagia.  Patient reports new onset solid food dysphagia for the last 3 to 4 months.  When eating meats, she feels items are getting stuck at the sternal notch and moved down very slowly.  Occasionally, she has to regurgitate the items.  Chronic history of reflux, primarily at night about 4 times a week.  Takes Tums only.  Denies nausea, vomiting, BRBPR, melena, unintentional weight loss.  No significant abdominal pain.  Occasionally, if she bends over a certain way, feels like something will grab her, but no persistent pain.  Bowels are moving well.  Takes celebrex for arthritis.   No prior EGD.   Colonoscopy April 2018 with 3 mm hyperplastic polyp removed, internal hemorrhoids s/p banding x3. Recommended 10-year repeat.   Past Medical History:  Diagnosis Date   Anxiety    Arthritis    Cancer ([place])    Headache(784.0)    Hypercholesteremia    Psoriasis    Vaginal Pap smear, abnormal    cervical cancer    Past Surgical History:  Procedure Laterality Date   ABDOMINAL HYSTERECTOMY     COLONOSCOPY  04/05/2012   Surgeon: Rogene Houston, MD; 2 tubular adenomas, 1 sessile serrated polyp, and 1 hyperplastic polyp.   COLONOSCOPY N/A 08/17/2016   Surgeon: Danie Binder, MD; 3 mm hyperplastic polyp, internal hemorrhoids s/p banding x3.  Recommended 10-year repeat.   HEMORRHOID BANDING N/A 08/17/2016   Procedure: HEMORRHOID BANDING;  Surgeon: Danie Binder, MD;  [facility];  Service: Endoscopy;  Laterality: N/A;   INCISION AND DRAINAGE Right 08/01/2015   Procedure: INCISION AND DRAINAGE RIGHT RING FINGER;  Surgeon: Carole Civil, MD;  Location: [facility];  Service: Orthopedics;  Laterality:  Right;    Current Outpatient Medications  Medication Sig Dispense Refill   acetaminophen (TYLENOL) 500 MG tablet Take 1,000 mg by mouth daily as needed for headache.     ALPRAZolam (XANAX) 1 MG tablet TAKE  (1)  TABLET  THREElTIMES DAILY AS NEEDED.     Ascorbic Acid (LIQUID C 500) 500 MG/15ML LIQD See admin instructions.     augmented betamethasone dipropionate (DIPROLENE-AF) 0.05 % ointment Apply 1 application topically. Twice a week  3   celecoxib (CELEBREX) 200 MG capsule TAKE (1) CAPSULE DAILY EVENING 90 capsule 0   Cholecalciferol 25 MCG (1000 UT) capsule 1 capsule Orally Once a day for 30 day(s)     Cyanocobalamin 5000 MCG/ML LIQD See admin instructions.     ezetimibe (ZETIA) 10 MG tablet TAKE 1 TABLET DAILY 90 tablet 1   folic acid (FOLVITE) 1 MG tablet Take 1 mg by mouth as directed.     hydrocortisone 2.5 % cream Apply topically 2 (two) times daily as needed.     lisinopril (ZESTRIL) 10 MG tablet Take 10 mg by mouth daily.     pantoprazole (PROTONIX) 40 MG tablet Take 1 tablet (40 mg total) by mouth daily before supper. 30 tablet 3   rosuvastatin (CRESTOR) 20 MG tablet TAKE 1 TABLET IN THE EVENING 90 tablet 0   No current facility-administered medications for this visit.    Allergies as of 12/15/2021 - Review Complete 12/15/2021  Allergen Reaction Noted  Other Hives 08/12/2016    Family History  Problem Relation Age of Onset   Ovarian cancer Mother    Bladder Cancer Father    Lung cancer Sister    Cancer Brother        liver   Throat cancer Brother    Cancer Other    Colon cancer Neg Hx     Social History   Socioeconomic History   Marital status: Single    Spouse name: Not on file   Number of children: 1   Years of education: 11   Highest education level: Not on file  Occupational History    Employer: [employer]  Tobacco Use   Smoking status: Former    Packs/day: 1.00    Years: 44.00    Total pack years: 44.00    Types: Cigarettes   Smokeless tobacco:  Never  Vaping Use   Vaping Use: Never used  Substance and Sexual Activity   Alcohol use: No    Alcohol/week: 0.0 standard drinks of alcohol   Drug use: No   Sexual activity: Yes    Birth control/protection: Surgical    Comment: hyst  Other Topics Concern   Not on file  Social History Narrative   Lives alone most of the time.   Social Determinants of Health   Financial Resource Strain: Low Risk  (06/05/2020)   Overall Financial Resource Strain (CARDIA)    Difficulty of Paying Living Expenses: Not hard at all  Food Insecurity: No Food Insecurity (06/05/2020)   Hunger Vital Sign    Worried About Running Out of Food in the Last Year: Never true    Ran Out of Food in the Last Year: Never true  Transportation Needs: No Transportation Needs (06/05/2020)   PRAPARE - Hydrologist (Medical): No    Lack of Transportation (Non-Medical): No  Physical Activity: Insufficiently Active (06/05/2020)   Exercise Vital Sign    Days of Exercise per Week: 4 days    Minutes of Exercise per Session: 20 min  Stress: Stress Concern Present (06/05/2020)   [place]    Feeling of Stress : To some extent  Social Connections: Moderately Isolated (06/05/2020)   Social Connection and Isolation Panel [NHANES]    Frequency of Communication with Friends and Family: More than three times a week    Frequency of Social Gatherings with Friends and Family: More than three times a week    Attends Religious Services: 1 to 4 times per year    Active Member of [employer] or Organizations: No    Attends Archivist Meetings: Never    Marital Status: Never married  Intimate Partner Violence: Not At Risk (06/05/2020)   Humiliation, Afraid, Rape, and Kick questionnaire    Fear of Current or Ex-Partner: No    Emotionally Abused: No    Physically Abused: No    Sexually Abused: No    Review of Systems: Gen: Denies any fever,  chills, cold or flulike symptoms, presyncope, syncope. CV: Denies chest pain, heart palpitations.  Resp: Denies shortness of breath, cough. GI: See HPI GU : Denies urinary burning, urinary frequency, urinary hesitancy MS: Admits to arthritis. Derm: Denies rash. Psych: Denies depression, anxiety. Heme: See HPI  Physical Exam: BP 121/81 (BP Location: Right Arm, Patient Position: Sitting, Cuff Size: Normal)   Pulse 99   Temp 97.9 F (36.6 C) (Temporal)   Ht '5\' 2"'$  (1.575 m)  Wt 191 lb 3.2 oz (86.7 kg)   BMI 34.97 kg/m  General:   Alert and oriented. Pleasant and cooperative. Well-nourished and well-developed.  Head:  Normocephalic and atraumatic. Eyes:  Without icterus, sclera clear and conjunctiva pink.  Ears:  Normal auditory acuity. Lungs:  Clear to auscultation bilaterally. No wheezes, rales, or rhonchi. No distress.  Heart:  S1, S2 present without murmurs appreciated.  Abdomen:  +BS, soft, non-tender and non-distended. No HSM noted. No guarding or rebound. No masses appreciated.  Rectal:  Deferred  Msk:  Symmetrical without gross deformities. Normal posture. Extremities:  Without edema. Neurologic:  Alert and  oriented x4;  grossly normal neurologically. Skin:  Intact without significant lesions or rashes. Psych:   Normal mood and affect.    Assessment:  66 year old female with history of anxiety, cervical cancer, HLD, presenting today for further evaluation of new onset solid food dysphagia x3-4 months with intermittent regurgitation.  Also reporting chronic history of reflux with primarily nocturnal symptoms using Tums only.  No other significant symptoms. No unintentional weight loss. No prior EGD.  Family history significant for brother with "throat cancer".  Differentials include esophagitis, esophageal web, ring, stricture related to chronic GERD, motility disorder, and unable to rule out malignancy.  She needs EGD for further evaluation and therapeutic intervention.  Will also start PPI for reflux.    Plan:  Proceed with upper endoscopy +/- dilation with propofol by Dr. Abbey Chatters in near future. The risks, benefits, and alternatives have been discussed with the patient in detail. The patient states understanding and desires to proceed.  ASA 2 Dysphagia precautions:  Eat slowly, take small bites, chew thoroughly, drink plenty of liquids throughout meals.  Avoid trough textures All meats should be chopped finely.  If something gets hung in your esophagus and will not come up or go down, proceed to the emergency room.   Start pantoprazole 40 mg daily 30 minutes before dinner. Counseled on GERD diet/lifestyle.  Separate instructions provided.  See AVS.    Aliene Altes, PA-C [facility] 12/15/2021

## 2021-12-15 ENCOUNTER — Encounter: Payer: Self-pay | Admitting: *Deleted

## 2021-12-15 ENCOUNTER — Telehealth: Payer: Self-pay | Admitting: *Deleted

## 2021-12-15 ENCOUNTER — Encounter: Payer: Self-pay | Admitting: Gastroenterology

## 2021-12-15 ENCOUNTER — Other Ambulatory Visit: Payer: Self-pay | Admitting: Gastroenterology

## 2021-12-15 ENCOUNTER — Ambulatory Visit (INDEPENDENT_AMBULATORY_CARE_PROVIDER_SITE_OTHER): Payer: Medicare Other | Admitting: Gastroenterology

## 2021-12-15 VITALS — BP 121/81 | HR 99 | Temp 97.9°F | Ht 62.0 in | Wt 191.2 lb

## 2021-12-15 DIAGNOSIS — R1310 Dysphagia, unspecified: Secondary | ICD-10-CM | POA: Diagnosis not present

## 2021-12-15 DIAGNOSIS — K219 Gastro-esophageal reflux disease without esophagitis: Secondary | ICD-10-CM | POA: Diagnosis not present

## 2021-12-15 MED ORDER — PANTOPRAZOLE SODIUM 40 MG PO TBEC: 40.0000 mg | DELAYED_RELEASE_TABLET | Freq: Every day | ORAL | 3 refills | Status: DC

## 2021-12-15 NOTE — Telephone Encounter (Signed)
Pt called and states she would like prescriptions sent to [facility]

## 2021-12-15 NOTE — Patient Instructions (Signed)
We will arrange for you to have an upper endoscopy with possible stretching of your esophagus in the near future with Dr. Abbey Chatters.  Swallowing precautions:  Eat slowly, take small bites, chew thoroughly, drink plenty of liquids throughout meals.  Avoid trough textures All meats should be chopped finely.  If something gets hung in your esophagus and will not come up or go down, proceed to the emergency room.    For reflux, start pantoprazole 40 mg daily 30 minutes before dinner.  Follow a GERD diet/lifestyle:  Avoid fried, fatty, greasy, spicy, citrus foods. Avoid caffeine and carbonated beverages. Avoid chocolate. Try eating 4-6 small meals a day rather than 3 large meals. Do not eat within 3 hours of laying down. Prop head of bed up on wood or bricks to create a 6 inch incline.  We will see you back after your upper endoscopy.  It was nice meeting you today!   Aliene Altes, PA-C [facility]

## 2021-12-15 NOTE — Telephone Encounter (Signed)
PA approved via cohere. DOS: 01/05/2022 - 04/05/2022, Tracking #[id number]

## 2021-12-15 NOTE — Telephone Encounter (Signed)
Rx sent 

## 2021-12-16 NOTE — Telephone Encounter (Signed)
Noted  

## 2021-12-30 ENCOUNTER — Encounter (HOSPITAL_COMMUNITY)
Admission: RE | Admit: 2021-12-30 | Discharge: 2021-12-30 | Disposition: A | Discharge status: Home / Self Care | Payer: Medicare Other | Source: Ambulatory Visit | Attending: Internal Medicine | Admitting: Internal Medicine

## 2021-12-30 ENCOUNTER — Encounter (HOSPITAL_COMMUNITY): Payer: Self-pay

## 2021-12-30 ENCOUNTER — Other Ambulatory Visit: Payer: Self-pay

## 2021-12-30 NOTE — Pre-Procedure Instructions (Signed)
Attempted pre-op phonecall. Left VM for her to call us back. 

## 2022-01-05 ENCOUNTER — Encounter (HOSPITAL_COMMUNITY): Payer: Self-pay

## 2022-01-05 ENCOUNTER — Ambulatory Visit (HOSPITAL_BASED_OUTPATIENT_CLINIC_OR_DEPARTMENT_OTHER): Payer: Medicare Other | Admitting: Anesthesiology

## 2022-01-05 ENCOUNTER — Ambulatory Visit (HOSPITAL_COMMUNITY)
Admission: RE | Admit: 2022-01-05 | Discharge: 2022-01-05 | Disposition: A | Discharge status: Home / Self Care | Payer: Medicare Other | Attending: Internal Medicine | Admitting: Internal Medicine

## 2022-01-05 ENCOUNTER — Ambulatory Visit (HOSPITAL_COMMUNITY): Payer: Medicare Other | Admitting: Anesthesiology

## 2022-01-05 ENCOUNTER — Encounter (HOSPITAL_COMMUNITY)
Admission: RE | Disposition: A | Discharge status: Home / Self Care | Payer: Self-pay | Source: Home / Self Care | Attending: Internal Medicine

## 2022-01-05 DIAGNOSIS — K2970 Gastritis, unspecified, without bleeding: Secondary | ICD-10-CM

## 2022-01-05 DIAGNOSIS — K2289 Other specified disease of esophagus: Secondary | ICD-10-CM | POA: Diagnosis not present

## 2022-01-05 DIAGNOSIS — Z8541 Personal history of malignant neoplasm of cervix uteri: Secondary | ICD-10-CM | POA: Insufficient documentation

## 2022-01-05 DIAGNOSIS — R12 Heartburn: Secondary | ICD-10-CM | POA: Diagnosis not present

## 2022-01-05 DIAGNOSIS — I10 Essential (primary) hypertension: Secondary | ICD-10-CM | POA: Diagnosis not present

## 2022-01-05 DIAGNOSIS — K2950 Unspecified chronic gastritis without bleeding: Secondary | ICD-10-CM | POA: Diagnosis not present

## 2022-01-05 DIAGNOSIS — K222 Esophageal obstruction: Secondary | ICD-10-CM

## 2022-01-05 DIAGNOSIS — E7800 Pure hypercholesterolemia, unspecified: Secondary | ICD-10-CM | POA: Insufficient documentation

## 2022-01-05 DIAGNOSIS — R1310 Dysphagia, unspecified: Secondary | ICD-10-CM | POA: Diagnosis present

## 2022-01-05 DIAGNOSIS — Z87891 Personal history of nicotine dependence: Secondary | ICD-10-CM | POA: Insufficient documentation

## 2022-01-05 DIAGNOSIS — K449 Diaphragmatic hernia without obstruction or gangrene: Secondary | ICD-10-CM

## 2022-01-05 DIAGNOSIS — K219 Gastro-esophageal reflux disease without esophagitis: Secondary | ICD-10-CM | POA: Diagnosis not present

## 2022-01-05 HISTORY — PX: ESOPHAGOGASTRODUODENOSCOPY (EGD) WITH PROPOFOL: SHX5813

## 2022-01-05 HISTORY — PX: BIOPSY: SHX5522

## 2022-01-05 HISTORY — PX: BALLOON DILATION: SHX5330

## 2022-01-05 SURGERY — ESOPHAGOGASTRODUODENOSCOPY (EGD) WITH PROPOFOL
Anesthesia: General

## 2022-01-05 MED ORDER — PROPOFOL 500 MG/50ML IV EMUL
INTRAVENOUS | Status: DC | PRN
  Administered 2022-01-05: 150 ug/kg/min via INTRAVENOUS

## 2022-01-05 MED ORDER — PROPOFOL 500 MG/50ML IV EMUL
INTRAVENOUS | Status: AC
  Filled 2022-01-05: qty 50

## 2022-01-05 MED ORDER — LACTATED RINGERS IV SOLN: INTRAVENOUS | Status: DC

## 2022-01-05 MED ORDER — PROPOFOL 10 MG/ML IV BOLUS
INTRAVENOUS | Status: DC | PRN
  Administered 2022-01-05: 80 mg via INTRAVENOUS

## 2022-01-05 NOTE — Anesthesia Preprocedure Evaluation (Signed)
Anesthesia Evaluation  Patient identified by MRN, date of birth, ID band Patient awake    Reviewed: Allergy & Precautions, NPO status , Patient's Chart, lab work & pertinent test results  Airway Mallampati: II  TM Distance: >3 FB Neck ROM: Full    Dental  (+) Dental Advisory Given, Edentulous Upper, Missing   Pulmonary neg pulmonary ROS, former smoker,    Pulmonary exam normal breath sounds clear to auscultation       Cardiovascular hypertension, Pt. on medications Normal cardiovascular exam Rhythm:Regular Rate:Normal     Neuro/Psych  Headaches, Anxiety TIAnegative psych ROS   GI/Hepatic GERD  Medicated,(+)     substance abuse (Benzodiazepine use, controlled substance agreement)  ,   Endo/Other  negative endocrine ROS  Renal/GU negative Renal ROS  Female GU complaint (cervical cancer)     Musculoskeletal  (+) Arthritis  (psoriasis), Osteoarthritis,    Abdominal   Peds negative pediatric ROS (+)  Hematology negative hematology ROS (+)   Anesthesia Other Findings   Reproductive/Obstetrics negative OB ROS                           Anesthesia Physical Anesthesia Plan  ASA: 2  Anesthesia Plan: General   Post-op Pain Management: Minimal or no pain anticipated   Induction: Intravenous  PONV Risk Score and Plan:   Airway Management Planned: Nasal Cannula and Natural Airway  Additional Equipment:   Intra-op Plan:   Post-operative Plan:   Informed Consent: I have reviewed the patients History and Physical, chart, labs and discussed the procedure including the risks, benefits and alternatives for the proposed anesthesia with the patient or authorized representative who has indicated his/her understanding and acceptance.     Dental advisory given  Plan Discussed with: CRNA and Surgeon  Anesthesia Plan Comments:        Anesthesia Quick Evaluation

## 2022-01-05 NOTE — Op Note (Signed)
[facility] Patient Name: Joyce [name] Procedure Date: 01/05/2022 10:43 AM MRN: [id number] Date of Birth: 10-25-1955 Attending MD: Elon Alas. Abbey Chatters DO CSN: [id number] Age: 66 Admit Type: Outpatient Procedure:                Upper GI endoscopy Indications:              Dysphagia, Heartburn Providers:                Elon Alas. Abbey Chatters, DO, Charlsie Quest. Insurance claims handler, Therapist, sports,                            Suzan Garibaldi. Risa Grill, Technician Referring MD:             Elon Alas. Abbey Chatters, DO Medicines:                See the Anesthesia note for documentation of the                            administered medications Complications:            No immediate complications. Estimated Blood Loss:     Estimated blood loss was minimal. Procedure:                Pre-Anesthesia Assessment:                           - The anesthesia plan was to use monitored                            anesthesia care (MAC).                           After obtaining informed consent, the endoscope was                            passed under direct vision. Throughout the                            procedure, the patient's blood pressure, pulse, and                            oxygen saturations were monitored continuously. The                            GIF-H190 ([id number]) scope was introduced through the                            mouth, and advanced to the second part of duodenum.                            The upper GI endoscopy was accomplished without                            difficulty. The patient tolerated the procedure  well. Scope In: 10:54:45 AM Scope Out: 11:01:05 AM Total Procedure Duration: 0 hours 6 minutes 20 seconds  Findings:      A small hiatal hernia was present.      The Z-line was variable and was found 36 cm from the incisors. Biopsies       were taken with a cold forceps for histology.      A mild Schatzki ring was found in the distal esophagus. A TTS dilator       was  passed through the scope. Dilation with an 18-19-20 mm balloon       dilator was performed to 20 mm. The dilation site was examined and       showed mild mucosal disruption and moderate improvement in luminal       narrowing.      Patchy mild inflammation characterized by erythema was found in the       gastric fundus and in the gastric body. Biopsies were taken with a cold       forceps for Helicobacter pylori testing.      The duodenal bulb, first portion of the duodenum and second portion of       the duodenum were normal. Impression:               - Small hiatal hernia.                           - Z-line variable, 36 cm from the incisors.                            Biopsied.                           - Mild Schatzki ring. Dilated.                           - Gastritis. Biopsied.                           - Normal duodenal bulb, first portion of the                            duodenum and second portion of the duodenum. Moderate Sedation:      Per Anesthesia Care Recommendation:           - Patient has a contact number available for                            emergencies. The signs and symptoms of potential                            delayed complications were discussed with the                            patient. Return to normal activities tomorrow.                            Written discharge instructions were provided to the  patient.                           - Resume previous diet.                           - Continue present medications.                           - Await pathology results.                           - Repeat upper endoscopy PRN for retreatment.                           - Return to GI clinic in 4 months.                           - Use Protonix (pantoprazole) 40 mg PO daily. Procedure Code(s):        --- Professional ---                           [phone], Esophagogastroduodenoscopy, flexible,                            transoral; with  transendoscopic balloon dilation of                            esophagus (less than 30 mm diameter)                           43239, 59, Esophagogastroduodenoscopy, flexible,                            transoral; with biopsy, single or multiple Diagnosis Code(s):        --- Professional ---                           K44.9, Diaphragmatic hernia without obstruction or                            gangrene                           K22.8, Other specified diseases of esophagus                           K22.2, Esophageal obstruction                           K29.70, Gastritis, unspecified, without bleeding                           R13.10, Dysphagia, unspecified                           R12, Heartburn CPT copyright 2019 [employer] Medical Association. All rights reserved. The codes documented in this report are  preliminary and upon coder review may  be revised to meet current compliance requirements. Elon Alas. Abbey Chatters, DO [place] Abbey Chatters, DO 01/05/2022 11:03:51 AM This report has been signed electronically. Number of Addenda: 0

## 2022-01-05 NOTE — Anesthesia Postprocedure Evaluation (Signed)
Anesthesia Post Note  Patient: Joyce [name]  Procedure(s) Performed: ESOPHAGOGASTRODUODENOSCOPY (EGD) WITH PROPOFOL BALLOON DILATION BIOPSY  Patient location during evaluation: Phase II Anesthesia Type: General Level of consciousness: awake and alert and oriented Pain management: pain level controlled Vital Signs Assessment: post-procedure vital signs reviewed and stable Respiratory status: spontaneous breathing, nonlabored ventilation and respiratory function stable Cardiovascular status: blood pressure returned to baseline and stable Postop Assessment: no apparent nausea or vomiting Anesthetic complications: no   No notable events documented.   Last Vitals:  Vitals:   01/05/22 0905 01/05/22 1107  BP: 129/78 (!) 105/49  Pulse: 68   Resp: 18 17  Temp: 36.9 C (!) 36.4 C  SpO2: 99% 97%    Last Pain:  Vitals:   01/05/22 1107  TempSrc: Oral  PainSc: 0-No pain                 [name] C Eleesha Purkey

## 2022-01-05 NOTE — Interval H&P Note (Signed)
History and Physical Interval Note:  01/05/2022 10:22 AM  Joyce [name]  has presented today for surgery, with the diagnosis of dysphagia,GERD.  The various methods of treatment have been discussed with the patient and family. After consideration of risks, benefits and other options for treatment, the patient has consented to  Procedure(s) with comments: ESOPHAGOGASTRODUODENOSCOPY (EGD) WITH PROPOFOL (N/A) - 10:15 am  ASA 2 BALLOON DILATION (N/A) as a surgical intervention.  The patient's history has been reviewed, patient examined, no change in status, stable for surgery.  I have reviewed the patient's chart and labs.  Questions were answered to the patient's satisfaction.     Eloise Harman

## 2022-01-05 NOTE — Transfer of Care (Signed)
Immediate Anesthesia Transfer of Care Note  Patient: Joyce [name]  Procedure(s) Performed: ESOPHAGOGASTRODUODENOSCOPY (EGD) WITH PROPOFOL BALLOON DILATION BIOPSY  Patient Location: PACU  Anesthesia Type:General  Level of Consciousness: awake, alert  and oriented  Airway & Oxygen Therapy: Patient Spontanous Breathing  Post-op Assessment: Report given to RN, Post -op Vital signs reviewed and stable, Patient moving all extremities X 4 and Patient able to stick tongue midline  Post vital signs: Reviewed  Last Vitals:  Vitals Value Taken Time  BP 105/49 01/05/22 1107  Temp 36.4 C 01/05/22 1107  Pulse 80   Resp 17 01/05/22 1107  SpO2 97 % 01/05/22 1107    Last Pain:  Vitals:   01/05/22 1107  TempSrc: Oral  PainSc: 0-No pain         Complications: No notable events documented.

## 2022-01-05 NOTE — Discharge Instructions (Addendum)
EGD Discharge instructions Please read the instructions outlined below and refer to this sheet in the next few weeks. These discharge instructions provide you with general information on caring for yourself after you leave the hospital. Your doctor may also give you specific instructions. While your treatment has been planned according to the most current medical practices available, unavoidable complications occasionally occur. If you have any problems or questions after discharge, please call your doctor. ACTIVITY You may resume your regular activity but move at a slower pace for the next 24 hours.  Take frequent rest periods for the next 24 hours.  Walking will help expel (get rid of) the air and reduce the bloated feeling in your abdomen.  No driving for 24 hours (because of the anesthesia (medicine) used during the test).  You may shower.  Do not sign any important legal documents or operate any machinery for 24 hours (because of the anesthesia used during the test).  NUTRITION Drink plenty of fluids.  You may resume your normal diet.  Begin with a light meal and progress to your normal diet.  Avoid alcoholic beverages for 24 hours or as instructed by your caregiver.  MEDICATIONS You may resume your normal medications unless your caregiver tells you otherwise.  WHAT YOU CAN EXPECT TODAY You may experience abdominal discomfort such as a feeling of fullness or "gas" pains.  FOLLOW-UP Your doctor will discuss the results of your test with you.  SEEK IMMEDIATE MEDICAL ATTENTION IF ANY OF THE FOLLOWING OCCUR: Excessive nausea (feeling sick to your stomach) and/or vomiting.  Severe abdominal pain and distention (swelling).  Trouble swallowing.  Temperature over 101 F (37.8 C).  Rectal bleeding or vomiting of blood.   Your EGD revealed mild amount inflammation in your stomach.  I took biopsies of this to rule out infection with a bacteria called H. pylori.  Await pathology results, my  office will contact you.  You also had a slight narrowing of your esophagus called a Schatzki's ring.  I dilated this today.  Hopefully this helps with your swallowing.  Continue on pantoprazole daily.  Follow-up with GI in 3 to 4 months. OFFICE TO CALL WITH APPOINTMENT   I hope you have a great rest of your week!  Elon Alas. Abbey Chatters, D.O. Gastroenterology and Hepatology [facility]

## 2022-01-10 LAB — SURGICAL PATHOLOGY

## 2022-01-11 ENCOUNTER — Encounter (HOSPITAL_COMMUNITY): Payer: Self-pay | Admitting: Internal Medicine

## 2022-01-12 ENCOUNTER — Telehealth: Payer: Self-pay | Admitting: Internal Medicine

## 2022-01-12 DIAGNOSIS — K219 Gastro-esophageal reflux disease without esophagitis: Secondary | ICD-10-CM

## 2022-01-12 MED ORDER — METRONIDAZOLE 500 MG PO TABS: 500.0000 mg | ORAL_TABLET | Freq: Three times a day (TID) | ORAL | 0 refills | Status: AC

## 2022-01-12 MED ORDER — BISMUTH 262 MG PO CHEW: 2.0000 | CHEWABLE_TABLET | Freq: Four times a day (QID) | ORAL | 0 refills | Status: AC

## 2022-01-12 MED ORDER — PANTOPRAZOLE SODIUM 40 MG PO TBEC: 40.0000 mg | DELAYED_RELEASE_TABLET | Freq: Two times a day (BID) | ORAL | 5 refills | Status: DC

## 2022-01-12 MED ORDER — TETRACYCLINE HCL 500 MG PO CAPS: 500.0000 mg | ORAL_CAPSULE | Freq: Four times a day (QID) | ORAL | 0 refills | Status: AC

## 2022-01-12 NOTE — Telephone Encounter (Signed)
Please let patient know further stains show that she has H. pylori gastritis.  I will send in 14-day course of tetracycline 500 mg 4 times daily, metronidazole 500 mg 3 times daily.   I am going to increase patient's pantoprazole to 40 mg twice daily.   I will also send in bismuth to take 4 times daily as well.    We will discuss eradication testing on follow-up visit.

## 2022-01-12 NOTE — Telephone Encounter (Signed)
Phoned and LMOVM for the pt to return call 

## 2022-01-13 NOTE — Telephone Encounter (Signed)
Phoned the pt and LMOVM for the pt to return call 

## 2022-01-13 NOTE — Telephone Encounter (Signed)
Phoned and advised the pt of the result note and the medications (with directions) that was phoned in. Pt will call once all the medication has been taken so she can schedule appt for follow up care. Pt expressed understanding

## 2022-02-15 ENCOUNTER — Other Ambulatory Visit: Payer: Self-pay | Admitting: Gastroenterology

## 2022-02-15 DIAGNOSIS — K219 Gastro-esophageal reflux disease without esophagitis: Secondary | ICD-10-CM

## 2022-03-03 ENCOUNTER — Encounter: Payer: Self-pay | Admitting: Gastroenterology

## 2022-03-25 ENCOUNTER — Ambulatory Visit: Payer: Medicare Other | Admitting: Gastroenterology

## 2022-03-25 ENCOUNTER — Encounter: Payer: Self-pay | Admitting: Gastroenterology

## 2022-03-25 ENCOUNTER — Telehealth: Payer: Self-pay | Admitting: *Deleted

## 2022-03-25 VITALS — BP 139/86 | HR 80 | Temp 97.7°F | Ht 62.5 in | Wt 198.2 lb

## 2022-03-25 DIAGNOSIS — K219 Gastro-esophageal reflux disease without esophagitis: Secondary | ICD-10-CM

## 2022-03-25 DIAGNOSIS — R1319 Other dysphagia: Secondary | ICD-10-CM | POA: Diagnosis not present

## 2022-03-25 MED ORDER — OMEPRAZOLE 40 MG PO CPDR: 40.0000 mg | DELAYED_RELEASE_CAPSULE | Freq: Two times a day (BID) | ORAL | 1 refills | Status: DC

## 2022-03-25 NOTE — Telephone Encounter (Signed)
Pt's BPE is scheduled for 03/29/22 at 9:30 am, arrive at 9:15 am, nothing to eat or drink 3 hours prior to procedure.  [place]

## 2022-03-25 NOTE — Patient Instructions (Signed)
Stop pantoprazole. Start omeprazole '40mg'$  daily before breakfast and evening meal.  Barium esophagram to be scheduled. We will contact you within 10 business days with results after completed.  Please be sure to wait at least 3 hours between meals and laying down. Do not overeat, eating smaller meals more frequently can be helpful with patients who are prone to regurgitate. See below for more helpful tips.  Once we have your reflux under better control, we will plan to complete H.pylori stool testing to make sure your infection is cleared up. You will have to be off acid reflux medications and antibiotics for two weeks before testing.  Return to the office in 3 months.    Gastroesophageal Reflux Disease, Adult Gastroesophageal reflux (GER) happens when acid from the stomach flows up into the tube that connects the mouth and the stomach (esophagus). Normally, food travels down the esophagus and stays in the stomach to be digested. However, when a person has GER, food and stomach acid sometimes move back up into the esophagus. If this becomes a more serious problem, the person may be diagnosed with a disease called gastroesophageal reflux disease (GERD). GERD occurs when the reflux: Happens often. Causes frequent or severe symptoms. Causes problems such as damage to the esophagus. When stomach acid comes in contact with the esophagus, the acid may cause inflammation in the esophagus. Over time, GERD may create small holes (ulcers) in the lining of the esophagus. What are the causes? This condition is caused by a problem with the muscle between the esophagus and the stomach (lower esophageal sphincter, or LES). Normally, the LES muscle closes after food passes through the esophagus to the stomach. When the LES is weakened or abnormal, it does not close properly, and that allows food and stomach acid to go back up into the esophagus. The LES can be weakened by certain dietary substances, medicines, and  medical conditions, including: Tobacco use. Pregnancy. Having a hiatal hernia. Alcohol use. Certain foods and beverages, such as coffee, chocolate, onions, and peppermint. What increases the risk? You are more likely to develop this condition if you: Have an increased body weight. Have a connective tissue disorder. Take NSAIDs, such as ibuprofen. What are the signs or symptoms? Symptoms of this condition include: Heartburn. Difficult or painful swallowing and the feeling of having a lump in the throat. A bitter taste in the mouth. Bad breath and having a large amount of saliva. Having an upset or bloated stomach and belching. Chest pain. Different conditions can cause chest pain. Make sure you see your health care provider if you experience chest pain. Shortness of breath or wheezing. Ongoing (chronic) cough or a nighttime cough. Wearing away of tooth enamel. Weight loss. How is this diagnosed? This condition may be diagnosed based on a medical history and a physical exam. To determine if you have mild or severe GERD, your health care provider may also monitor how you respond to treatment. You may also have tests, including: A test to examine your stomach and esophagus with a small camera (endoscopy). A test that measures the acidity level in your esophagus. A test that measures how much pressure is on your esophagus. A barium swallow or modified barium swallow test to show the shape, size, and functioning of your esophagus. How is this treated? Treatment for this condition may vary depending on how severe your symptoms are. Your health care provider may recommend: Changes to your diet. Medicine. Surgery. The goal of treatment is to help relieve  your symptoms and to prevent complications. Follow these instructions at home: Eating and drinking  Follow a diet as recommended by your health care provider. This may involve avoiding foods and drinks such as: Coffee and tea, with or  without caffeine. Drinks that contain alcohol. Energy drinks and sports drinks. Carbonated drinks or sodas. Chocolate and cocoa. Peppermint and mint flavorings. Garlic and onions. Horseradish. Spicy and acidic foods, including peppers, chili powder, curry powder, vinegar, hot sauces, and barbecue sauce. Citrus fruit juices and citrus fruits, such as oranges, lemons, and limes. Tomato-based foods, such as red sauce, chili, salsa, and pizza with red sauce. Fried and fatty foods, such as donuts, french fries, potato chips, and high-fat dressings. High-fat meats, such as hot dogs and fatty cuts of red and white meats, such as rib eye steak, sausage, ham, and bacon. High-fat dairy items, such as whole milk, butter, and cream cheese. Eat small, frequent meals instead of large meals. Avoid drinking large amounts of liquid with your meals. Avoid eating meals during the 2-3 hours before bedtime. Avoid lying down right after you eat. Do not exercise right after you eat. Lifestyle  Do not use any products that contain nicotine or tobacco. These products include cigarettes, chewing tobacco, and vaping devices, such as e-cigarettes. If you need help quitting, ask your health care provider. Try to reduce your stress by using methods such as yoga or meditation. If you need help reducing stress, ask your health care provider. If you are overweight, reduce your weight to an amount that is healthy for you. Ask your health care provider for guidance about a safe weight loss goal. General instructions Pay attention to any changes in your symptoms. Take over-the-counter and prescription medicines only as told by your health care provider. Do not take aspirin, ibuprofen, or other NSAIDs unless your health care provider told you to take these medicines. Wear loose-fitting clothing. Do not wear anything tight around your waist that causes pressure on your abdomen. Raise (elevate) the head of your bed about 6  inches (15 cm). You can use a wedge to do this. Avoid bending over if this makes your symptoms worse. Keep all follow-up visits. This is important. Contact a health care provider if: You have: New symptoms. Unexplained weight loss. Difficulty swallowing or it hurts to swallow. Wheezing or a persistent cough. A hoarse voice. Your symptoms do not improve with treatment. Get help right away if: You have sudden pain in your arms, neck, jaw, teeth, or back. You suddenly feel sweaty, dizzy, or light-headed. You have chest pain or shortness of breath. You vomit and the vomit is green, yellow, or black, or it looks like blood or coffee grounds. You faint. You have stool that is red, bloody, or black. You cannot swallow, drink, or eat. These symptoms may represent a serious problem that is an emergency. Do not wait to see if the symptoms will go away. Get medical help right away. Call your local emergency services (911 in the [place] Do not drive yourself to the hospital. Summary Gastroesophageal reflux happens when acid from the stomach flows up into the esophagus. GERD is a disease in which the reflux happens often, causes frequent or severe symptoms, or causes problems such as damage to the esophagus. Treatment for this condition may vary depending on how severe your symptoms are. Your health care provider may recommend diet and lifestyle changes, medicine, or surgery. Contact a health care provider if you have new or worsening symptoms. Take over-the-counter  and prescription medicines only as told by your health care provider. Do not take aspirin, ibuprofen, or other NSAIDs unless your health care provider told you to do so. Keep all follow-up visits as told by your health care provider. This is important. This information is not intended to replace advice given to you by your health care provider. Make sure you discuss any questions you have with your health care provider. Document Revised:  10/21/2019 Document Reviewed: 10/21/2019 Elsevier Patient Education  [place] for Gastroesophageal Reflux Disease, Adult When you have gastroesophageal reflux disease (GERD), the foods you eat and your eating habits are very important. Choosing the right foods can help ease the discomfort of GERD. Consider working with a dietitian to help you make healthy food choices. What are tips for following this plan? Reading food labels Look for foods that are low in saturated fat. Foods that have less than 5% of daily value (DV) of fat and 0 g of trans fats may help with your symptoms. Cooking Cook foods using methods other than frying. This may include baking, steaming, grilling, or broiling. These are all methods that do not need a lot of fat for cooking. To add flavor, try to use herbs that are low in spice and acidity. Meal planning  Choose healthy foods that are low in fat, such as fruits, vegetables, whole grains, low-fat dairy products, lean meats, fish, and poultry. Eat frequent, small meals instead of three large meals each day. Eat your meals slowly, in a relaxed setting. Avoid bending over or lying down until 2-3 hours after eating. Limit high-fat foods such as fatty meats or fried foods. Limit your intake of fatty foods, such as oils, butter, and shortening. Avoid the following as told by your health care provider: Foods that cause symptoms. These may be different for different people. Keep a food diary to keep track of foods that cause symptoms. Alcohol. Drinking large amounts of liquid with meals. Eating meals during the 2-3 hours before bed. Lifestyle Maintain a healthy weight. Ask your health care provider what weight is healthy for you. If you need to lose weight, work with your health care provider to do so safely. Exercise for at least 30 minutes on 5 or more days each week, or as told by your health care provider. Avoid wearing clothes that fit tightly  around your waist and chest. Do not use any products that contain nicotine or tobacco. These products include cigarettes, chewing tobacco, and vaping devices, such as e-cigarettes. If you need help quitting, ask your health care provider. Sleep with the head of your bed raised. Use a wedge under the mattress or blocks under the bed frame to raise the head of the bed. Chew sugar-free gum after mealtimes. What foods should I eat?  Eat a healthy, well-balanced diet of fruits, vegetables, whole grains, low-fat dairy products, lean meats, fish, and poultry. Each person is different. Foods that may trigger symptoms in one person may not trigger any symptoms in another person. Work with your health care provider to identify foods that are safe for you. The items listed above may not be a complete list of recommended foods and beverages. Contact a dietitian for more information. What foods should I avoid? Limiting some of these foods may help manage the symptoms of GERD. Everyone is different. Consult a dietitian or your health care provider to help you identify the exact foods to avoid, if any. Fruits Any fruits prepared with  added fat. Any fruits that cause symptoms. For some people this may include citrus fruits, such as oranges, grapefruit, pineapple, and lemons. Vegetables Deep-fried vegetables. [place] fries. Any vegetables prepared with added fat. Any vegetables that cause symptoms. For some people, this may include tomatoes and tomato products, chili peppers, onions and garlic, and horseradish. Grains Pastries or quick breads with added fat. Meats and other proteins High-fat meats, such as fatty beef or pork, hot dogs, ribs, ham, sausage, salami, and bacon. Fried meat or protein, including fried fish and fried chicken. Nuts and nut butters, in large amounts. Dairy Whole milk and chocolate milk. Sour cream. Cream. Ice cream. Cream cheese. Milkshakes. Fats and oils Butter. Margarine. Shortening.  Ghee. Beverages Coffee and tea, with or without caffeine. Carbonated beverages. Sodas. Energy drinks. Fruit juice made with acidic fruits, such as orange or grapefruit. Tomato juice. Alcoholic drinks. Sweets and desserts Chocolate and cocoa. Donuts. Seasonings and condiments Pepper. Peppermint and spearmint. Added salt. Any condiments, herbs, or seasonings that cause symptoms. For some people, this may include curry, hot sauce, or vinegar-based salad dressings. The items listed above may not be a complete list of foods and beverages to avoid. Contact a dietitian for more information. Questions to ask your health care provider Diet and lifestyle changes are usually the first steps that are taken to manage symptoms of GERD. If diet and lifestyle changes do not improve your symptoms, talk with your health care provider about taking medicines. Where to find more information [employer] for Gastrointestinal Disorders: aboutgerd.org Summary When you have gastroesophageal reflux disease (GERD), food and lifestyle choices may be very helpful in easing the discomfort of GERD. Eat frequent, small meals instead of three large meals each day. Eat your meals slowly, in a relaxed setting. Avoid bending over or lying down until 2-3 hours after eating. Limit high-fat foods such as fatty meats or fried foods. This information is not intended to replace advice given to you by your health care provider. Make sure you discuss any questions you have with your health care provider. Document Revised: 10/21/2019 Document Reviewed: 10/21/2019 Elsevier Patient Education  [place].

## 2022-03-25 NOTE — Progress Notes (Signed)
GI Office Note    Referring Provider: Adaline Sill, NP Primary Care Physician:  Adaline Sill, NP  Primary Gastroenterologist: Elon Alas. Abbey Chatters, DO   Chief Complaint   Chief Complaint  Patient presents with   Gastroesophageal Reflux    Pantoprazole 40 mg twice daily not working.     History of Present Illness   Joyce [name] is a 66 y.o. female presenting today for follow-up.  Last seen in the office in August for dysphagia.  Chronic GERD, primarily nocturnal symptoms at least 4 times per week, on Tums only at the time of her visit.  Also on Celebrex for arthritis.  Started on pantoprazole daily at last office visit.  H. pylori gastritis noted on recent EGD.  She was prescribed with bismuth, tetracycline, metronidazole, pantoprazole BID.  Patient believes she only received 1 medication other than pantoprazole and bismuth.  Since her EGD, she has noted no improvement in her dysphagia.  Continues to feel discomfort as food passes through the esophagus.  She also feels like her reflux is worse.  She has been on pantoprazole twice daily.  Denies any heartburn during the day.  At nighttime she still has quite a bit of nocturnal regurgitation although it cannot burn.  Often gets choked when she wakes up with regurgitation.  Having a lot of belching/flatulence.  Typically stool 1-2 times daily, [place] 5 for several years.  No melena or rectal bleeding.  Has gained 7 pounds since August.  63 year old daughter had significant stroke in the past few months, she has been going daily to help her out.  She seems to be doing better gaining back lost function.  Has been stressful for patient.  History of adenomatous colon polyps at time of colonoscopy in 2013.  Also with sessile serrated polyp at that time.  Colonoscopy in 2018 with hyperplastic polyp.  Dr. Oneida Alar advised him to follow-up colonoscopy at that time, patient wondering if she should wait that long.  We will ask Dr. Abbey Chatters  his opinion based on current guidelines.    EGD September 2023: - Small hiatal hernia. - Z-line variable, 36 cm from the incisors. Biopsied.  Unremarkable. - Mild Schatzki ring. Dilated. - Gastritis. Biopsied.  Mildly active chronic gastritis with H. pylori on stains. - Normal duodenal bulb, first portion of the duodenum and second portion of the   Medications   Current Outpatient Medications  Medication Sig Dispense Refill   acetaminophen (TYLENOL) 500 MG tablet Take 1,000 mg by mouth every 6 (six) hours as needed for headache.     ALPRAZolam (XANAX) 1 MG tablet Take 1 mg by mouth 3 (three) times daily.     Ascorbic Acid (VITAMIN C GUMMIE PO) Take 2 tablets by mouth daily.     betamethasone dipropionate 0.05 % cream Apply topically.     Biotin w/ Vitamins C & E (HAIR/SKIN/NAILS PO) Take 2 tablets by mouth daily.     celecoxib (CELEBREX) 200 MG capsule TAKE (1) CAPSULE DAILY EVENING 90 capsule 0   Cholecalciferol 50 MCG (2000 UT) TABS Take 4,000 Units by mouth daily.     Cyanocobalamin 3000 MCG SUBL Take 3,000 mcg by mouth daily.     ezetimibe (ZETIA) 10 MG tablet TAKE 1 TABLET DAILY 90 tablet 1   folic acid (FOLVITE) 1 MG tablet Take 1 mg by mouth daily.     lisinopril (ZESTRIL) 10 MG tablet Take 10 mg by mouth daily.     MELATONIN GUMMIES  PO Take 2 tablets by mouth at bedtime as needed (sleep).     methotrexate (RHEUMATREX) 2.5 MG tablet Take 15 mg by mouth once a week.     pantoprazole (PROTONIX) 40 MG tablet Take 1 tablet (40 mg total) by mouth daily before breakfast. (Patient taking differently: Take 40 mg by mouth 2 (two) times daily.) 90 tablet 1   Potassium 99 MG TABS Take 99 mg by mouth daily.     rosuvastatin (CRESTOR) 20 MG tablet TAKE 1 TABLET IN THE EVENING 90 tablet 0   No current facility-administered medications for this visit.    Allergies   Allergies as of 03/25/2022 - Review Complete 03/25/2022  Allergen Reaction Noted   Other Hives 08/12/2016      Review  of Systems   General: Negative for anorexia, weight loss, fever, chills, fatigue, weakness. ENT: Negative for hoarseness, difficulty swallowing , nasal congestion. CV: Negative for chest pain, angina, palpitations, dyspnea on exertion, peripheral edema.  Respiratory: Negative for dyspnea at rest, dyspnea on exertion, cough, sputum, wheezing.  GI: See history of present illness. GU:  Negative for dysuria, hematuria, urinary incontinence, urinary frequency, nocturnal urination.  Endo: Negative for unusual weight change.     Physical Exam   BP 139/86 (BP Location: Right Arm, Patient Position: Sitting, Cuff Size: Large)   Pulse 80   Temp 97.7 F (36.5 C) (Oral)   Ht 5' 2.5" (1.588 m)   Wt 198 lb 3.2 oz (89.9 kg)   SpO2 92%   BMI 35.67 kg/m    General: Well-nourished, well-developed in no acute distress.  Eyes: No icterus.  Abdomen: Bowel sounds are normal, nontender, nondistended, no hepatosplenomegaly or masses,  no abdominal bruits or hernia , no rebound or guarding.  Rectal: not Performed Extremities: No lower extremity edema. No clubbing or deformities. Neuro: Alert and oriented x 4   Skin: Warm and dry, no jaundice.   Psych: Alert and cooperative, normal mood and affect.  Labs   None available.   Imaging Studies   No results found.  Assessment   GERD: ongoing reflux described as regurgitation especially at night. No heartburn. Possible exacerbation post h.pylori treatment vs increase in weight and stress.   Dysphagia: no improvement with recent dilation of Schatzki ring. BPE to reevaluate.   H.pylori gastritis: patient is not sure if she took two different antibiotics with part of her treatment. We will confirm eradication in the future.    PLAN   Stop pantoprazole. Start omeprazole '40mg'$  BID before breakfast and evening meal.  Barium esophagram to evaluate ongoing dysphagia.  Reinforced antireflux measures.  Once reflux better controlled, she will need to  undergo H.pylori eradication testing off PPI.  Determine timing of surveillance colonoscopy, to discuss with Dr. Abbey Chatters.  Return ov in 3 months.    Laureen Ochs. Bobby Rumpf, [place], [facility]

## 2022-03-28 ENCOUNTER — Telehealth: Payer: Self-pay | Admitting: Internal Medicine

## 2022-03-28 NOTE — Telephone Encounter (Signed)
Pt informed of procedure date and time, states she can not do tomorrow due to having another doctors appointment.  Rescheduled to 04/01/22 at 9:30 am,arrive at 9:15 am, nothing to eat or drink 3 hours prior. Verbalized understanding.

## 2022-03-28 NOTE — Telephone Encounter (Signed)
See previous TE

## 2022-03-28 NOTE — Telephone Encounter (Signed)
Joyce [name], this patient left a message Friday after we left that someone had called her.

## 2022-03-29 ENCOUNTER — Other Ambulatory Visit (HOSPITAL_COMMUNITY): Payer: Medicare Other

## 2022-04-01 ENCOUNTER — Ambulatory Visit (HOSPITAL_COMMUNITY)
Admission: RE | Admit: 2022-04-01 | Discharge: 2022-04-01 | Disposition: A | Discharge status: Home / Self Care | Payer: Medicare Other | Source: Ambulatory Visit | Attending: Gastroenterology | Admitting: Gastroenterology

## 2022-04-01 DIAGNOSIS — K219 Gastro-esophageal reflux disease without esophagitis: Secondary | ICD-10-CM | POA: Diagnosis present

## 2022-04-01 DIAGNOSIS — R1319 Other dysphagia: Secondary | ICD-10-CM

## 2022-04-25 HISTORY — PX: OTHER SURGICAL HISTORY: SHX169

## 2022-05-05 ENCOUNTER — Ambulatory Visit: Payer: Medicare Other | Admitting: Internal Medicine

## 2022-05-13 ENCOUNTER — Telehealth: Payer: Self-pay

## 2022-05-13 MED ORDER — OMEPRAZOLE 40 MG PO CPDR: 40.0000 mg | DELAYED_RELEASE_CAPSULE | Freq: Two times a day (BID) | ORAL | 3 refills | Status: DC

## 2022-05-13 NOTE — Telephone Encounter (Signed)
Pt is requesting a refill on her Omeprazole 40 mg. Her last ov was 03/25/2022.

## 2022-05-13 NOTE — Telephone Encounter (Signed)
Wasn't sure which pharmacy to send it to. She has multiple pharmacies listed. I sent it to [facility] which is where I sent it in December.

## 2022-05-13 NOTE — Addendum Note (Signed)
Addended by: Mahala Menghini on: 05/13/2022 11:41 AM   Modules accepted: Orders

## 2022-05-13 NOTE — Telephone Encounter (Signed)
FYI:  Phoned and LMOVM for the pt that her Rx was sent to [facility] where she had specified (I didn't note it to [place] but in my notes).

## 2022-06-24 ENCOUNTER — Ambulatory Visit: Payer: Medicare Other | Admitting: Internal Medicine

## 2022-06-28 ENCOUNTER — Other Ambulatory Visit (HOSPITAL_COMMUNITY): Payer: Self-pay | Admitting: Adult Health

## 2022-06-28 DIAGNOSIS — Z1231 Encounter for screening mammogram for malignant neoplasm of breast: Secondary | ICD-10-CM

## 2022-06-29 ENCOUNTER — Other Ambulatory Visit (HOSPITAL_COMMUNITY): Payer: Self-pay | Admitting: Occupational Therapy

## 2022-06-29 ENCOUNTER — Encounter: Payer: Self-pay | Admitting: Internal Medicine

## 2022-06-29 ENCOUNTER — Ambulatory Visit (INDEPENDENT_AMBULATORY_CARE_PROVIDER_SITE_OTHER): Payer: Medicare Other | Admitting: Internal Medicine

## 2022-06-29 ENCOUNTER — Telehealth (INDEPENDENT_AMBULATORY_CARE_PROVIDER_SITE_OTHER): Payer: Self-pay | Admitting: Internal Medicine

## 2022-06-29 VITALS — BP 116/75 | HR 79 | Temp 98.0°F | Ht 62.5 in | Wt 195.9 lb

## 2022-06-29 DIAGNOSIS — K219 Gastro-esophageal reflux disease without esophagitis: Secondary | ICD-10-CM

## 2022-06-29 DIAGNOSIS — D122 Benign neoplasm of ascending colon: Secondary | ICD-10-CM | POA: Diagnosis not present

## 2022-06-29 DIAGNOSIS — B9681 Helicobacter pylori [H. pylori] as the cause of diseases classified elsewhere: Secondary | ICD-10-CM

## 2022-06-29 DIAGNOSIS — R1312 Dysphagia, oropharyngeal phase: Secondary | ICD-10-CM

## 2022-06-29 DIAGNOSIS — K2970 Gastritis, unspecified, without bleeding: Secondary | ICD-10-CM | POA: Diagnosis not present

## 2022-06-29 MED ORDER — PEG 3350-KCL-NA BICARB-NACL 420 G PO SOLR: 4000.0000 mL | Freq: Once | ORAL | 0 refills | Status: AC

## 2022-06-29 MED ORDER — OMEPRAZOLE 40 MG PO CPDR: 40.0000 mg | DELAYED_RELEASE_CAPSULE | Freq: Two times a day (BID) | ORAL | 3 refills | Status: AC

## 2022-06-29 NOTE — Telephone Encounter (Signed)
Contacted pt and scheduled TSC ASA 3. Instructions mailed to patient and prep sent to pharmacy.

## 2022-06-29 NOTE — Patient Instructions (Signed)
We will schedule you for colonoscopy for surveillance purposes given your history of polyps.  I am also going to order a modified barium swallow study to further evaluate your swallowing.  Continue on omeprazole.  I sent in a year supply refills today.  Otherwise follow-up in 3 to 4 months.  It was very nice seeing you again today.  Dr. Abbey Chatters

## 2022-06-29 NOTE — Progress Notes (Signed)
Referring Provider: Adaline Sill, NP Primary Care Physician:  Adaline Sill, NP Primary GI:  Dr. Abbey Chatters  Chief Complaint  Patient presents with   Dysphagia    Reports pills are getting hung in throat and having some trouble swallowing. Cuts food into small bites and still has trouble swallowing.     HPI:   Joyce [name] is a 67 y.o. female who presents to the clinic today for follow up visit.   Chronic GERD, primarily nocturnal symptoms at least 4 times per week, on Tums only for a while.  Also on Celebrex for arthritis.  Started on pantoprazole daily previously, switched to Omeprazole 40 mg BID and doing better. Reflux better controlled.   H. pylori gastritis noted on EGD 01/05/22.  She was prescribed with bismuth, tetracycline, metronidazole, pantoprazole BID.  Patient believes she only received 1 medication other than pantoprazole and bismuth.  Completed full course however.  No eradication testing on file that I can see.  Continues to have issues with dysphagia.  States her symptoms are primarily oropharyngeal.  Notes pills get stuck in the back of her throat.  Underwent BPE which showed age-related dysmotility.  No improvement in her swallowing after esophageal dilation.  History of adenomatous colon polyps at time of colonoscopy in 2013. Also with sessile serrated polyp at that time. Colonoscopy in 2018 with hyperplastic polyp.   Past Medical History:  Diagnosis Date   Anxiety    Arthritis    Cancer ([place])    Headache(784.0)    Hypercholesteremia    Psoriasis    Vaginal Pap smear, abnormal    cervical cancer    Past Surgical History:  Procedure Laterality Date   ABDOMINAL HYSTERECTOMY     BALLOON DILATION N/A 01/05/2022   Procedure: BALLOON DILATION;  Surgeon: Eloise Harman, DO;  [facility];  Service: Endoscopy;  Laterality: N/A;   BIOPSY  01/05/2022   Procedure: BIOPSY;  Surgeon: Eloise Harman, DO;  [facility];  Service:  Endoscopy;;   COLONOSCOPY  04/05/2012   Surgeon: Rogene Houston, MD; 2 tubular adenomas, 1 sessile serrated polyp, and 1 hyperplastic polyp.   COLONOSCOPY N/A 08/17/2016   Surgeon: Danie Binder, MD; 3 mm hyperplastic polyp, internal hemorrhoids s/p banding x3.  Recommended 10-year repeat.   ESOPHAGOGASTRODUODENOSCOPY (EGD) WITH PROPOFOL N/A 01/05/2022   Procedure: ESOPHAGOGASTRODUODENOSCOPY (EGD) WITH PROPOFOL;  Surgeon: Eloise Harman, DO;  [facility];  Service: Endoscopy;  Laterality: N/A;  10:15 am  ASA 2   HEMORRHOID BANDING N/A 08/17/2016   Procedure: HEMORRHOID BANDING;  Surgeon: Danie Binder, MD;  [facility];  Service: Endoscopy;  Laterality: N/A;   INCISION AND DRAINAGE Right 08/01/2015   Procedure: INCISION AND DRAINAGE RIGHT RING FINGER;  Surgeon: Carole Civil, MD;  Location: [facility];  Service: Orthopedics;  Laterality: Right;    Current Outpatient Medications  Medication Sig Dispense Refill   acetaminophen (TYLENOL) 500 MG tablet Take 1,000 mg by mouth every 6 (six) hours as needed for headache.     ALPRAZolam (XANAX) 1 MG tablet Take 1 mg by mouth. One qid prn     Ascorbic Acid (VITAMIN C GUMMIE PO) Take 2 tablets by mouth daily.     betamethasone dipropionate 0.05 % cream Apply topically.     celecoxib (CELEBREX) 200 MG capsule TAKE (1) CAPSULE DAILY EVENING 90 capsule 0   Cholecalciferol 50 MCG (2000 UT) TABS Take 4,000 Units by  mouth daily.     Cyanocobalamin 3000 MCG SUBL Take 3,000 mcg by mouth daily.     ezetimibe (ZETIA) 10 MG tablet TAKE 1 TABLET DAILY 90 tablet 1   folic acid (FOLVITE) 1 MG tablet Take 1 mg by mouth daily.     lisinopril (ZESTRIL) 10 MG tablet Take 10 mg by mouth daily.     MELATONIN GUMMIES PO Take 2 tablets by mouth at bedtime as needed (sleep).     methotrexate (RHEUMATREX) 2.5 MG tablet Take by mouth once a week. Takes 9 tablets daily     omeprazole (PRILOSEC) 40 MG capsule Take 1 capsule (40 mg total) by mouth  2 (two) times daily before a meal. Before breakfast and evening meal 60 capsule 3   Potassium 99 MG TABS Take 99 mg by mouth daily.     rosuvastatin (CRESTOR) 20 MG tablet TAKE 1 TABLET IN THE EVENING 90 tablet 0   No current facility-administered medications for this visit.    Allergies as of 06/29/2022 - Review Complete 06/29/2022  Allergen Reaction Noted   Other Hives 08/12/2016    Family History  Problem Relation Age of Onset   Ovarian cancer Mother    Bladder Cancer Father    Lung cancer Sister    Cancer Brother        liver   Throat cancer Brother    Cancer Other    Colon cancer Neg Hx     Social History   Socioeconomic History   Marital status: Single    Spouse name: Not on file   Number of children: 1   Years of education: 11   Highest education level: Not on file  Occupational History    Employer: [employer]  Tobacco Use   Smoking status: Former    Packs/day: 1.00    Years: 44.00    Total pack years: 44.00    Types: Cigarettes    Quit date: 01/08/2014    Years since quitting: 8.4    Passive exposure: Past   Smokeless tobacco: Never  Vaping Use   Vaping Use: Never used  Substance and Sexual Activity   Alcohol use: No    Alcohol/week: 0.0 standard drinks of alcohol   Drug use: No   Sexual activity: Yes    Birth control/protection: Surgical    Comment: hyst  Other Topics Concern   Not on file  Social History Narrative   Lives alone most of the time.   Social Determinants of Health   Financial Resource Strain: Low Risk  (06/05/2020)   Overall Financial Resource Strain (CARDIA)    Difficulty of Paying Living Expenses: Not hard at all  Food Insecurity: No Food Insecurity (06/05/2020)   Hunger Vital Sign    Worried About Running Out of Food in the Last Year: Never true    Ran Out of Food in the Last Year: Never true  Transportation Needs: No Transportation Needs (06/05/2020)   PRAPARE - Hydrologist (Medical): No    Lack  of Transportation (Non-Medical): No  Physical Activity: Insufficiently Active (06/05/2020)   Exercise Vital Sign    Days of Exercise per Week: 4 days    Minutes of Exercise per Session: 20 min  Stress: Stress Concern Present (06/05/2020)   [place]    Feeling of Stress : To some extent  Social Connections: Moderately Isolated (06/05/2020)   Social Connection and Isolation Panel [NHANES]  Frequency of Communication with Friends and Family: More than three times a week    Frequency of Social Gatherings with Friends and Family: More than three times a week    Attends Religious Services: 1 to 4 times per year    Active Member of [employer] or Organizations: No    Attends Archivist Meetings: Never    Marital Status: Never married    Subjective: Review of Systems  Constitutional:  Negative for chills and fever.  HENT:  Negative for congestion and hearing loss.   Eyes:  Negative for blurred vision and double vision.  Respiratory:  Negative for cough and shortness of breath.   Cardiovascular:  Negative for chest pain and palpitations.  Gastrointestinal:  Positive for heartburn. Negative for abdominal pain, blood in stool, constipation, diarrhea, melena and vomiting.       Oropharyngeal dysphagia   Genitourinary:  Negative for dysuria and urgency.  Musculoskeletal:  Negative for joint pain and myalgias.  Skin:  Negative for itching and rash.  Neurological:  Negative for dizziness and headaches.  Psychiatric/Behavioral:  Negative for depression. The patient is not nervous/anxious.      Objective: BP 116/75 (BP Location: Left Arm, Patient Position: Sitting, Cuff Size: Large)   Pulse 79   Temp 98 F (36.7 C) (Oral)   Ht 5' 2.5" (1.588 m)   Wt 195 lb 14.4 oz (88.9 kg)   BMI 35.26 kg/m  Physical Exam Constitutional:      Appearance: Normal appearance.  HENT:     Head: Normocephalic and atraumatic.  Eyes:      Extraocular Movements: Extraocular movements intact.     Conjunctiva/sclera: Conjunctivae normal.  Cardiovascular:     Rate and Rhythm: Normal rate and regular rhythm.  Pulmonary:     Effort: Pulmonary effort is normal.     Breath sounds: Normal breath sounds.  Abdominal:     General: Bowel sounds are normal.     Palpations: Abdomen is soft.  Musculoskeletal:        General: No swelling. Normal range of motion.     Cervical back: Normal range of motion and neck supple.  Skin:    General: Skin is warm and dry.     Coloration: Skin is not jaundiced.  Neurological:     General: No focal deficit present.     Mental Status: She is alert and oriented to person, place, and time.  Psychiatric:        Mood and Affect: Mood normal.        Behavior: Behavior normal.      Assessment: *Chronic GERD-better controlled on omeprazole twice daily *Oropharyngeal dysphagia *Age-related esophageal dysmotility *H. pylori gastritis *Adenomatous colon polyps  Plan: GERD better controlled on omeprazole twice daily.  Will continue.  Her dysphagia appears to be more oropharyngeal.  Will order MBSS today with speech therapy.  Appreciate their help.  No eradication testing on file for her H. pylori.  Need to pursue this in the future.  Will schedule for surveillance colonoscopy.The risks including infection, bleed, or perforation as well as benefits, limitations, alternatives and imponderables have been reviewed with the patient. Questions have been answered. All parties agreeable.  Follow-up after colonoscopy.  06/29/2022 11:10 AM   Disclaimer: This note was dictated with voice recognition software. Similar sounding words can inadvertently be transcribed and may not be corrected upon review.

## 2022-06-29 NOTE — Addendum Note (Signed)
Addended by: Vicente Males on: 06/29/2022 12:11 PM   Modules accepted: Orders

## 2022-06-29 NOTE — Addendum Note (Signed)
Addended by: Cheron Every on: 06/29/2022 12:24 PM   Modules accepted: Orders

## 2022-06-29 NOTE — H&P (View-Only) (Signed)
  Referring Provider: Dickey, Kirkland M, NP Primary Care Physician:  Dickey, Kirkland M, NP Primary GI:  Dr. Arshawn Valdez  Chief Complaint  Patient presents with   Dysphagia    Reports pills are getting hung in throat and having some trouble swallowing. Cuts food into small bites and still has trouble swallowing.     HPI:   Joyce [name] is a 67 y.o. female who presents to the clinic today for follow up visit.   Chronic GERD, primarily nocturnal symptoms at least 4 times per week, on Tums only for a while.  Also on Celebrex for arthritis.  Started on pantoprazole daily previously, switched to Omeprazole 40 mg BID and doing better. Reflux better controlled.   H. pylori gastritis noted on EGD 01/05/22.  She was prescribed with bismuth, tetracycline, metronidazole, pantoprazole BID.  Patient believes she only received 1 medication other than pantoprazole and bismuth.  Completed full course however.  No eradication testing on file that I can see.  Continues to have issues with dysphagia.  States her symptoms are primarily oropharyngeal.  Notes pills get stuck in the back of her throat.  Underwent BPE which showed age-related dysmotility.  No improvement in her swallowing after esophageal dilation.  History of adenomatous colon polyps at time of colonoscopy in 2013. Also with sessile serrated polyp at that time. Colonoscopy in 2018 with hyperplastic polyp.   Past Medical History:  Diagnosis Date   Anxiety    Arthritis    Cancer ([facility])    Headache(784.0)    Hypercholesteremia    Psoriasis    Vaginal Pap smear, abnormal    cervical cancer    Past Surgical History:  Procedure Laterality Date   ABDOMINAL HYSTERECTOMY     BALLOON DILATION N/A 01/05/2022   Procedure: BALLOON DILATION;  Surgeon: Jeryn Bertoni K, DO;  [facility];  Service: Endoscopy;  Laterality: N/A;   BIOPSY  01/05/2022   Procedure: BIOPSY;  Surgeon: Sowmya Partridge K, DO;  [facility];  Service:  Endoscopy;;   COLONOSCOPY  04/05/2012   Surgeon: Najeeb U Rehman, MD; 2 tubular adenomas, 1 sessile serrated polyp, and 1 hyperplastic polyp.   COLONOSCOPY N/A 08/17/2016   Surgeon: Sandi L Fields, MD; 3 mm hyperplastic polyp, internal hemorrhoids s/p banding x3.  Recommended 10-year repeat.   ESOPHAGOGASTRODUODENOSCOPY (EGD) WITH PROPOFOL N/A 01/05/2022   Procedure: ESOPHAGOGASTRODUODENOSCOPY (EGD) WITH PROPOFOL;  Surgeon: Leonette Tischer K, DO;  [facility];  Service: Endoscopy;  Laterality: N/A;  10:15 am  ASA 2   HEMORRHOID BANDING N/A 08/17/2016   Procedure: HEMORRHOID BANDING;  Surgeon: Sandi L Fields, MD;  [facility];  Service: Endoscopy;  Laterality: N/A;   INCISION AND DRAINAGE Right 08/01/2015   Procedure: INCISION AND DRAINAGE RIGHT RING FINGER;  Surgeon: Stanley E Harrison, MD;  Location: [facility];  Service: Orthopedics;  Laterality: Right;    Current Outpatient Medications  Medication Sig Dispense Refill   acetaminophen (TYLENOL) 500 MG tablet Take 1,000 mg by mouth every 6 (six) hours as needed for headache.     ALPRAZolam (XANAX) 1 MG tablet Take 1 mg by mouth. One qid prn     Ascorbic Acid (VITAMIN C GUMMIE PO) Take 2 tablets by mouth daily.     betamethasone dipropionate 0.05 % cream Apply topically.     celecoxib (CELEBREX) 200 MG capsule TAKE (1) CAPSULE DAILY EVENING 90 capsule 0   Cholecalciferol 50 MCG (2000 UT) TABS Take 4,000 Units by   mouth daily.     Cyanocobalamin 3000 MCG SUBL Take 3,000 mcg by mouth daily.     ezetimibe (ZETIA) 10 MG tablet TAKE 1 TABLET DAILY 90 tablet 1   folic acid (FOLVITE) 1 MG tablet Take 1 mg by mouth daily.     lisinopril (ZESTRIL) 10 MG tablet Take 10 mg by mouth daily.     MELATONIN GUMMIES PO Take 2 tablets by mouth at bedtime as needed (sleep).     methotrexate (RHEUMATREX) 2.5 MG tablet Take by mouth once a week. Takes 9 tablets daily     omeprazole (PRILOSEC) 40 MG capsule Take 1 capsule (40 mg total) by mouth  2 (two) times daily before a meal. Before breakfast and evening meal 60 capsule 3   Potassium 99 MG TABS Take 99 mg by mouth daily.     rosuvastatin (CRESTOR) 20 MG tablet TAKE 1 TABLET IN THE EVENING 90 tablet 0   No current facility-administered medications for this visit.    Allergies as of 06/29/2022 - Review Complete 06/29/2022  Allergen Reaction Noted   Other Hives 08/12/2016    Family History  Problem Relation Age of Onset   Ovarian cancer Mother    Bladder Cancer Father    Lung cancer Sister    Cancer Brother        liver   Throat cancer Brother    Cancer Other    Colon cancer Neg Hx     Social History   Socioeconomic History   Marital status: Single    Spouse name: Not on file   Number of children: 1   Years of education: 11   Highest education level: Not on file  Occupational History    Employer: [employer]  Tobacco Use   Smoking status: Former    Packs/day: 1.00    Years: 44.00    Total pack years: 44.00    Types: Cigarettes    Quit date: 01/08/2014    Years since quitting: 8.4    Passive exposure: Past   Smokeless tobacco: Never  Vaping Use   Vaping Use: Never used  Substance and Sexual Activity   Alcohol use: No    Alcohol/week: 0.0 standard drinks of alcohol   Drug use: No   Sexual activity: Yes    Birth control/protection: Surgical    Comment: hyst  Other Topics Concern   Not on file  Social History Narrative   Lives alone most of the time.   Social Determinants of Health   Financial Resource Strain: Low Risk  (06/05/2020)   Overall Financial Resource Strain (CARDIA)    Difficulty of Paying Living Expenses: Not hard at all  Food Insecurity: No Food Insecurity (06/05/2020)   Hunger Vital Sign    Worried About Running Out of Food in the Last Year: Never true    Ran Out of Food in the Last Year: Never true  Transportation Needs: No Transportation Needs (06/05/2020)   PRAPARE - Transportation    Lack of Transportation (Medical): No    Lack  of Transportation (Non-Medical): No  Physical Activity: Insufficiently Active (06/05/2020)   Exercise Vital Sign    Days of Exercise per Week: 4 days    Minutes of Exercise per Session: 20 min  Stress: Stress Concern Present (06/05/2020)   [employer] - Occupational Stress Questionnaire    Feeling of Stress : To some extent  Social Connections: Moderately Isolated (06/05/2020)   Social Connection and Isolation Panel [NHANES]      Frequency of Communication with Friends and Family: More than three times a week    Frequency of Social Gatherings with Friends and Family: More than three times a week    Attends Religious Services: 1 to 4 times per year    Active Member of Clubs or Organizations: No    Attends Club or Organization Meetings: Never    Marital Status: Never married    Subjective: Review of Systems  Constitutional:  Negative for chills and fever.  HENT:  Negative for congestion and hearing loss.   Eyes:  Negative for blurred vision and double vision.  Respiratory:  Negative for cough and shortness of breath.   Cardiovascular:  Negative for chest pain and palpitations.  Gastrointestinal:  Positive for heartburn. Negative for abdominal pain, blood in stool, constipation, diarrhea, melena and vomiting.       Oropharyngeal dysphagia   Genitourinary:  Negative for dysuria and urgency.  Musculoskeletal:  Negative for joint pain and myalgias.  Skin:  Negative for itching and rash.  Neurological:  Negative for dizziness and headaches.  Psychiatric/Behavioral:  Negative for depression. The patient is not nervous/anxious.      Objective: BP 116/75 (BP Location: Left Arm, Patient Position: Sitting, Cuff Size: Large)   Pulse 79   Temp 98 F (36.7 C) (Oral)   Ht 5' 2.5" (1.588 m)   Wt 195 lb 14.4 oz (88.9 kg)   BMI 35.26 kg/m  Physical Exam Constitutional:      Appearance: Normal appearance.  HENT:     Head: Normocephalic and atraumatic.  Eyes:      Extraocular Movements: Extraocular movements intact.     Conjunctiva/sclera: Conjunctivae normal.  Cardiovascular:     Rate and Rhythm: Normal rate and regular rhythm.  Pulmonary:     Effort: Pulmonary effort is normal.     Breath sounds: Normal breath sounds.  Abdominal:     General: Bowel sounds are normal.     Palpations: Abdomen is soft.  Musculoskeletal:        General: No swelling. Normal range of motion.     Cervical back: Normal range of motion and neck supple.  Skin:    General: Skin is warm and dry.     Coloration: Skin is not jaundiced.  Neurological:     General: No focal deficit present.     Mental Status: She is alert and oriented to person, place, and time.  Psychiatric:        Mood and Affect: Mood normal.        Behavior: Behavior normal.      Assessment: *Chronic GERD-better controlled on omeprazole twice daily *Oropharyngeal dysphagia *Age-related esophageal dysmotility *H. pylori gastritis *Adenomatous colon polyps  Plan: GERD better controlled on omeprazole twice daily.  Will continue.  Her dysphagia appears to be more oropharyngeal.  Will order MBSS today with speech therapy.  Appreciate their help.  No eradication testing on file for her H. pylori.  Need to pursue this in the future.  Will schedule for surveillance colonoscopy.The risks including infection, bleed, or perforation as well as benefits, limitations, alternatives and imponderables have been reviewed with the patient. Questions have been answered. All parties agreeable.  Follow-up after colonoscopy.  06/29/2022 11:10 AM   Disclaimer: This note was dictated with voice recognition software. Similar sounding words can inadvertently be transcribed and may not be corrected upon review.  

## 2022-06-29 NOTE — Telephone Encounter (Signed)
PA approved via [facility]  [id number] DOS-Jun 29, 2022 - Sep 27, 2022

## 2022-07-05 ENCOUNTER — Telehealth (INDEPENDENT_AMBULATORY_CARE_PROVIDER_SITE_OTHER): Payer: Self-pay | Admitting: Gastroenterology

## 2022-07-05 NOTE — Telephone Encounter (Signed)
Pt contacted and made aware of pre op appt. Pre op is scheduled for 07/26/22 in person at 9:30 at [facility]

## 2022-07-12 ENCOUNTER — Ambulatory Visit (HOSPITAL_COMMUNITY): Payer: Medicare Other | Attending: Internal Medicine | Admitting: Speech Pathology

## 2022-07-12 ENCOUNTER — Ambulatory Visit (HOSPITAL_COMMUNITY)
Admission: RE | Admit: 2022-07-12 | Discharge: 2022-07-12 | Disposition: A | Discharge status: Home / Self Care | Payer: Medicare Other | Source: Ambulatory Visit | Attending: Internal Medicine | Admitting: Internal Medicine

## 2022-07-12 ENCOUNTER — Encounter (HOSPITAL_COMMUNITY): Payer: Self-pay | Admitting: Speech Pathology

## 2022-07-12 DIAGNOSIS — K219 Gastro-esophageal reflux disease without esophagitis: Secondary | ICD-10-CM | POA: Insufficient documentation

## 2022-07-12 DIAGNOSIS — B9681 Helicobacter pylori [H. pylori] as the cause of diseases classified elsewhere: Secondary | ICD-10-CM | POA: Insufficient documentation

## 2022-07-12 DIAGNOSIS — K2970 Gastritis, unspecified, without bleeding: Secondary | ICD-10-CM | POA: Diagnosis not present

## 2022-07-12 DIAGNOSIS — R1312 Dysphagia, oropharyngeal phase: Secondary | ICD-10-CM | POA: Diagnosis not present

## 2022-07-12 DIAGNOSIS — R6339 Other feeding difficulties: Secondary | ICD-10-CM | POA: Diagnosis not present

## 2022-07-12 DIAGNOSIS — R1310 Dysphagia, unspecified: Secondary | ICD-10-CM | POA: Diagnosis not present

## 2022-07-12 NOTE — Therapy (Signed)
[facility] at [place], [place], [address] Phone: [phone]   Fax:  [phone]  Modified Barium Swallow  Patient Details  Name: Joyce [name] MRN: [id number] Date of Birth: [birth year] No data recorded  Encounter Date: 07/12/2022   End of Session - 07/12/22 1625     Visit Number 1    Number of Visits 1    Authorization Type U[employer] Medicare    SLP Start Time 1330    SLP Stop Time  1400    SLP Time Calculation (min) 30 min    Activity Tolerance Patient tolerated treatment well             Past Medical History:  Diagnosis Date   Anxiety    Arthritis    Cancer ([place])    Headache(784.0)    Hypercholesteremia    Psoriasis    Vaginal Pap smear, abnormal    cervical cancer    Past Surgical History:  Procedure Laterality Date   ABDOMINAL HYSTERECTOMY     BALLOON DILATION N/A 01/05/2022   Procedure: BALLOON DILATION;  Surgeon: Eloise Harman, DO;  [facility];  Service: Endoscopy;  Laterality: N/A;   BIOPSY  01/05/2022   Procedure: BIOPSY;  Surgeon: Eloise Harman, DO;  [facility];  Service: Endoscopy;;   COLONOSCOPY  04/05/2012   Surgeon: Rogene Houston, MD; 2 tubular adenomas, 1 sessile serrated polyp, and 1 hyperplastic polyp.   COLONOSCOPY N/A 08/17/2016   Surgeon: Danie Binder, MD; 3 mm hyperplastic polyp, internal hemorrhoids s/p banding x3.  Recommended 10-year repeat.   ESOPHAGOGASTRODUODENOSCOPY (EGD) WITH PROPOFOL N/A 01/05/2022   Procedure: ESOPHAGOGASTRODUODENOSCOPY (EGD) WITH PROPOFOL;  Surgeon: Eloise Harman, DO;  [facility];  Service: Endoscopy;  Laterality: N/A;  10:15 am  ASA 2   HEMORRHOID BANDING N/A 08/17/2016   Procedure: HEMORRHOID BANDING;  Surgeon: Danie Binder, MD;  [facility]: [facility];  Service: Endoscopy;  Laterality: N/A;   INCISION AND DRAINAGE Right 08/01/2015   Procedure: INCISION AND DRAINAGE RIGHT RING FINGER;  Surgeon:  Carole Civil, MD;  Location: [facility];  Service: Orthopedics;  Laterality: Right;    There were no vitals filed for this visit.        General - 07/12/22 1619       General Information   Date of Onset 06/29/22    HPI Joyce [name] is a 67 y.o. female who was referred by Dr. Hurshel Keys for MBSS due to Pt reports of oropharyngeal dysphagia. Chronic GERD, primarily nocturnal symptoms at least 4 times per week, on Tums only for a while.  Also on Celebrex for arthritis.  Started on pantoprazole daily previously, switched to Omeprazole 40 mg BID and doing better. Reflux better controlled. H. pylori gastritis noted on EGD 01/05/22. Continues to have issues with dysphagia.  States her symptoms are primarily oropharyngeal.  Notes pills get stuck in the back of her throat.  Underwent BPE (04/01/22) which showed age-related dysmotility.  No improvement in her swallowing after esophageal dilation (01/05/22).    Type of Study MBS-Modified Barium Swallow Study    Diet Prior to this Study Regular;Thin liquids (Level 0)    Temperature Spikes Noted No    Respiratory Status Room air    History of Recent Intubation No    Behavior/Cognition Alert;Cooperative;Pleasant mood    Oral Cavity: Oral Hygiene Within Functional Limits    Oral Care Completed by SLP No  Oral Cavity - Dentition Adequate natural dentition    Vision Functional for self feeding    Self-Feeding Abilities Able to feed self    Patient Positioning Upright in chair    Baseline Vocal Quality Normal    Volitional Cough Strong    Volitional Swallow Able to elicit    Anatomy Within functional limits    Pharyngeal Secretions Not observed secondary MBS                Oral Preparation/Oral Phase - 07/12/22 1620       Oral Preparation/Oral Phase   Oral Phase Within functional limits      Electrical stimulation - Oral Phase   Was Electrical Stimulation Used No              Pharyngeal Phase - 07/12/22 1620        Pharyngeal Phase   Pharyngeal Phase Impaired      Pharyngeal - Honey   Pharyngeal- Honey Teaspoon Within functional limits      Pharyngeal - Nectar   Pharyngeal- Nectar Teaspoon Within functional limits    Pharyngeal- Nectar Cup Within functional limits      Pharyngeal - Thin   Pharyngeal- Thin Teaspoon Within functional limits    Pharyngeal- Thin Cup Within functional limits    Pharyngeal- Thin Straw Within functional limits      Pharyngeal - Solids   Pharyngeal- Puree Within functional limits    Pharyngeal- Regular Within functional limits    Pharyngeal- Pill Within functional limits      Electrical Stimulation - Pharyngeal Phase   Was Electrical Stimulation Used No              Cricopharyngeal Phase - 07/12/22 1623       Cervical Esophageal Phase   Cervical Esophageal Phase Impaired      Cervical Esophageal Phase - Comment   Other Esophageal Phase Observations brief stasis of barium tablet in the thoracic esophagus which was not cleared by liquid wash, but was cleared by puree wash                      Plan - 07/12/22 1625     Clinical Impression Statement Pt presents with normal oropharyngeal swallow with prompt swallow trigger, no penetration/aspiration, and no significant oropharyngeal residuals (mild coating along tongue). Pt swallowed a barium tablet with thin liquids and it became transiently delayed in the thoracic esophagus. Pt given sequential straw sips of thin, but this did not clear the pill. She was then given puree which moved the tablet. She was encouraged to take large pills with water and follow with a bite of puree (applesauce, yogurt, banana, etc) and then follow with additional liquid wash to facilitate clearance of pills at home. Pt acknowledged understanding. No further SLP services indicated at this time.    Consulted and Agree with Plan of Care Patient             Patient will benefit from skilled therapeutic intervention in  order to improve the following deficits and impairments:   Dysphagia, oropharyngeal phase     Recommendations/Treatment - 07/12/22 1624       Swallow Evaluation Recommendations   SLP Diet Recommendations Thin;Age appropriate regular    Liquid Administration via Cup;Straw    Medication Administration Whole meds with liquid   follow large pills with bite of puree or food and then liquid wash   Supervision Patient able to self feed    Postural  Changes Seated upright at 90 degrees;Remain upright for at least 30 minutes after feeds/meals               Problem List Patient Active Problem List   Diagnosis Date Noted   Gastroesophageal reflux disease 12/15/2021   Dysphagia 12/15/2021   History of cervical cancer 03/05/2019   TIA (transient ischemic attack) Vs Syncope 02/13/2019   Osteoarthritis 02/01/2018   Obesity (BMI 30-39.9) 02/01/2018   Controlled substance agreement signed 02/01/2018   Benzodiazepine dependence ([place]) 02/01/2018   HTN (hypertension) 10/04/2016   Psoriasis 10/04/2016   Rectal bleeding 08/03/2016   Anal or rectal pain 08/03/2016   Hypercholesteremia 12/12/2012   Anxiety 12/12/2012   Thank you,  Genene Churn, [place]  Havoc Sanluis, [place] 07/12/2022, 4:30 PM  [facility] at [place], [place], [address] Phone: [phone]   Fax:  [phone]  Name: Joyce [name] MRN: [id number] Date of Birth: [birth year]

## 2022-07-20 DIAGNOSIS — M549 Dorsalgia, unspecified: Secondary | ICD-10-CM | POA: Diagnosis not present

## 2022-07-25 ENCOUNTER — Telehealth: Payer: Self-pay

## 2022-07-25 NOTE — Telephone Encounter (Signed)
Pt left a message on the phone regarding her appt for colonoscopy. States insurance may not pay. Please call

## 2022-07-26 ENCOUNTER — Encounter (HOSPITAL_COMMUNITY)
Admission: RE | Admit: 2022-07-26 | Discharge: 2022-07-26 | Disposition: A | Discharge status: Home / Self Care | Payer: Medicare Other | Source: Ambulatory Visit | Attending: Internal Medicine | Admitting: Internal Medicine

## 2022-07-26 ENCOUNTER — Encounter (HOSPITAL_COMMUNITY): Payer: Self-pay

## 2022-07-26 HISTORY — DX: Hyperlipidemia, unspecified: E78.5

## 2022-07-26 NOTE — Telephone Encounter (Signed)
Pt says she contacted her insurance and they told her if it had been 48 months since last colonoscopy, they would pay 100% and she told them that she had a history of polyps. She is saying that pre-service center called and said she was going to have to pay $300. I advised her to call the pre-service center and let them know what her insurance said. Gave her the number to pre-service center.

## 2022-07-28 ENCOUNTER — Other Ambulatory Visit: Payer: Self-pay

## 2022-07-28 ENCOUNTER — Encounter (HOSPITAL_COMMUNITY)
Admission: RE | Disposition: A | Discharge status: Home / Self Care | Payer: Self-pay | Source: Home / Self Care | Attending: Internal Medicine

## 2022-07-28 ENCOUNTER — Ambulatory Visit (HOSPITAL_BASED_OUTPATIENT_CLINIC_OR_DEPARTMENT_OTHER): Payer: Medicare Other | Admitting: Anesthesiology

## 2022-07-28 ENCOUNTER — Encounter (HOSPITAL_COMMUNITY): Payer: Self-pay

## 2022-07-28 ENCOUNTER — Ambulatory Visit (HOSPITAL_COMMUNITY): Payer: Medicare Other | Admitting: Anesthesiology

## 2022-07-28 ENCOUNTER — Ambulatory Visit (HOSPITAL_COMMUNITY)
Admission: RE | Admit: 2022-07-28 | Discharge: 2022-07-28 | Disposition: A | Discharge status: Home / Self Care | Payer: Medicare Other | Attending: Internal Medicine | Admitting: Internal Medicine

## 2022-07-28 DIAGNOSIS — K219 Gastro-esophageal reflux disease without esophagitis: Secondary | ICD-10-CM | POA: Diagnosis not present

## 2022-07-28 DIAGNOSIS — K649 Unspecified hemorrhoids: Secondary | ICD-10-CM | POA: Diagnosis not present

## 2022-07-28 DIAGNOSIS — Z87891 Personal history of nicotine dependence: Secondary | ICD-10-CM | POA: Diagnosis not present

## 2022-07-28 DIAGNOSIS — Z1211 Encounter for screening for malignant neoplasm of colon: Secondary | ICD-10-CM | POA: Diagnosis not present

## 2022-07-28 DIAGNOSIS — K635 Polyp of colon: Secondary | ICD-10-CM | POA: Diagnosis not present

## 2022-07-28 DIAGNOSIS — Z79899 Other long term (current) drug therapy: Secondary | ICD-10-CM | POA: Diagnosis not present

## 2022-07-28 DIAGNOSIS — K5730 Diverticulosis of large intestine without perforation or abscess without bleeding: Secondary | ICD-10-CM

## 2022-07-28 DIAGNOSIS — M1990 Unspecified osteoarthritis, unspecified site: Secondary | ICD-10-CM | POA: Diagnosis not present

## 2022-07-28 DIAGNOSIS — F419 Anxiety disorder, unspecified: Secondary | ICD-10-CM | POA: Diagnosis not present

## 2022-07-28 DIAGNOSIS — K5790 Diverticulosis of intestine, part unspecified, without perforation or abscess without bleeding: Secondary | ICD-10-CM | POA: Diagnosis not present

## 2022-07-28 DIAGNOSIS — Z86010 Personal history of colonic polyps: Secondary | ICD-10-CM

## 2022-07-28 DIAGNOSIS — D122 Benign neoplasm of ascending colon: Secondary | ICD-10-CM | POA: Diagnosis not present

## 2022-07-28 DIAGNOSIS — K648 Other hemorrhoids: Secondary | ICD-10-CM | POA: Diagnosis not present

## 2022-07-28 DIAGNOSIS — Z791 Long term (current) use of non-steroidal anti-inflammatories (NSAID): Secondary | ICD-10-CM | POA: Insufficient documentation

## 2022-07-28 HISTORY — PX: POLYPECTOMY: SHX5525

## 2022-07-28 HISTORY — PX: COLONOSCOPY WITH PROPOFOL: SHX5780

## 2022-07-28 SURGERY — COLONOSCOPY WITH PROPOFOL
Anesthesia: General

## 2022-07-28 MED ORDER — PROPOFOL 500 MG/50ML IV EMUL
INTRAVENOUS | Status: DC | PRN
  Administered 2022-07-28: 150 ug/kg/min via INTRAVENOUS

## 2022-07-28 MED ORDER — LACTATED RINGERS IV SOLN: INTRAVENOUS | Status: DC | PRN

## 2022-07-28 MED ORDER — PROPOFOL 10 MG/ML IV BOLUS
INTRAVENOUS | Status: DC | PRN
  Administered 2022-07-28: 80 mg via INTRAVENOUS

## 2022-07-28 MED ORDER — LIDOCAINE HCL (CARDIAC) PF 100 MG/5ML IV SOSY
PREFILLED_SYRINGE | INTRAVENOUS | Status: DC | PRN
  Administered 2022-07-28: 50 mg via INTRAVENOUS

## 2022-07-28 NOTE — Anesthesia Preprocedure Evaluation (Signed)
Anesthesia Evaluation  Patient identified by MRN, date of birth, ID band Patient awake    Reviewed: Allergy & Precautions, H&P , NPO status , Patient's Chart, lab work & pertinent test results, reviewed documented beta blocker date and time   Airway Mallampati: II  TM Distance: >3 FB Neck ROM: full    Dental no notable dental hx.    Pulmonary neg pulmonary ROS, former smoker   Pulmonary exam normal breath sounds clear to auscultation       Cardiovascular Exercise Tolerance: Good hypertension, negative cardio ROS  Rhythm:regular Rate:Normal     Neuro/Psych  Headaches  Anxiety     TIAnegative neurological ROS  negative psych ROS   GI/Hepatic negative GI ROS, Neg liver ROS,GERD  ,,  Endo/Other  negative endocrine ROS    Renal/GU negative Renal ROS  negative genitourinary   Musculoskeletal   Abdominal   Peds  Hematology negative hematology ROS (+)   Anesthesia Other Findings   Reproductive/Obstetrics negative OB ROS                             Anesthesia Physical Anesthesia Plan  ASA: 2  Anesthesia Plan: General   Post-op Pain Management:    Induction:   PONV Risk Score and Plan: Propofol infusion  Airway Management Planned:   Additional Equipment:   Intra-op Plan:   Post-operative Plan:   Informed Consent: I have reviewed the patients History and Physical, chart, labs and discussed the procedure including the risks, benefits and alternatives for the proposed anesthesia with the patient or authorized representative who has indicated his/her understanding and acceptance.     Dental Advisory Given  Plan Discussed with: CRNA  Anesthesia Plan Comments:        Anesthesia Quick Evaluation

## 2022-07-28 NOTE — Interval H&P Note (Signed)
History and Physical Interval Note:  07/28/2022 7:27 AM  Joyce [name]  has presented today for surgery, with the diagnosis of hx polyps.  The various methods of treatment have been discussed with the patient and family. After consideration of risks, benefits and other options for treatment, the patient has consented to  Procedure(s) with comments: COLONOSCOPY WITH PROPOFOL (N/A) - 815am, asa 3 as a surgical intervention.  The patient's history has been reviewed, patient examined, no change in status, stable for surgery.  I have reviewed the patient's chart and labs.  Questions were answered to the patient's satisfaction.     Eloise Harman

## 2022-07-28 NOTE — Transfer of Care (Signed)
Immediate Anesthesia Transfer of Care Note  Patient: Joyce [name]  Procedure(s) Performed: COLONOSCOPY WITH PROPOFOL POLYPECTOMY  Patient Location: Short Stay  Anesthesia Type:General  Level of Consciousness: awake  Airway & Oxygen Therapy: Patient Spontanous Breathing  Post-op Assessment: Report given to RN and Post -op Vital signs reviewed and stable  Post vital signs: Reviewed and stable  Last Vitals:  Vitals Value Taken Time  BP 97/54 07/28/22 0846  Temp 36.5 C 07/28/22 0846  Pulse 88 07/28/22 0846  Resp 12 07/28/22 0846  SpO2 96 % 07/28/22 0846    Last Pain:  Vitals:   07/28/22 0846  TempSrc: Oral  PainSc: 0-No pain         Complications: No notable events documented.

## 2022-07-28 NOTE — Op Note (Signed)
[facility] Patient Name: Joyce [name] Procedure Date: 07/28/2022 8:16 AM MRN: [id number] Date of Birth: 09-08-1955 Attending MD: Elon Alas. Abbey Chatters , [place], [id number] CSN: [id number] Age: 67 Admit Type: Outpatient Procedure:                Colonoscopy Indications:              Surveillance: Personal history of adenomatous                            polyps on last colonoscopy > 5 years ago Providers:                Elon Alas. Abbey Chatters, DO, Janeece Riggers, RN, Ladoris Gene                            Technician, Technician Referring MD:              Medicines:                See the Anesthesia note for documentation of the                            administered medications Complications:            No immediate complications. Estimated Blood Loss:     Estimated blood loss was minimal. Procedure:                Pre-Anesthesia Assessment:                           - The anesthesia plan was to use monitored                            anesthesia care (MAC).                           After obtaining informed consent, the colonoscope                            was passed under direct vision. Throughout the                            procedure, the patient's blood pressure, pulse, and                            oxygen saturations were monitored continuously. The                            [id number]-[id number]) scope was introduced through                            the anus and advanced to the the cecum, identified                            by appendiceal orifice and ileocecal valve. The                            colonoscopy was  performed without difficulty. The                            patient tolerated the procedure well. The quality                            of the bowel preparation was evaluated using the                            BBPS [facility] Bowel Preparation Scale) with scores                            of: Right Colon = 3, Transverse Colon = 3 and Left                             Colon = 3 (entire mucosa seen well with no residual                            staining, small fragments of stool or opaque                            liquid). The total BBPS score equals 9. Scope In: 8:28:52 AM Scope Out: 8:42:27 AM Scope Withdrawal Time: 0 hours 11 minutes 32 seconds  Total Procedure Duration: 0 hours 13 minutes 35 seconds  Findings:      Hemorrhoids were found on perianal exam.      Non-bleeding internal hemorrhoids were found during endoscopy.      Multiple small-mouthed diverticula were found in the sigmoid colon.      Three sessile polyps were found in the ascending colon. The polyps were       4 to 6 mm in size. These polyps were removed with a cold snare.       Resection and retrieval were complete.      The exam was otherwise without abnormality. Impression:               - Hemorrhoids found on perianal exam.                           - Non-bleeding internal hemorrhoids.                           - Diverticulosis in the sigmoid colon.                           - Three 4 to 6 mm polyps in the ascending colon,                            removed with a cold snare. Resected and retrieved.                           - The examination was otherwise normal. Moderate Sedation:      Per Anesthesia Care Recommendation:           - Patient has a contact number available for  emergencies. The signs and symptoms of potential                            delayed complications were discussed with the                            patient. Return to normal activities tomorrow.                            Written discharge instructions were provided to the                            patient.                           - Resume previous diet.                           - Continue present medications.                           - Await pathology results.                           - Repeat colonoscopy in 5 years for surveillance.                           -  Return to GI clinic PRN. Procedure Code(s):        --- Professional ---                           [phone], Colonoscopy, flexible; with removal of                            tumor(s), polyp(s), or other lesion(s) by snare                            technique Diagnosis Code(s):        --- Professional ---                           Z86.010, Personal history of colonic polyps                           K64.8, Other hemorrhoids                           D12.2, Benign neoplasm of ascending colon                           K57.30, Diverticulosis of large intestine without                            perforation or abscess without bleeding CPT copyright 2022 American Medical Association. All rights reserved. The codes documented in this report are preliminary and upon coder review may  be revised to meet current compliance requirements. Elon Alas. Abbey Chatters,  DO Elon Alas. Abbey Chatters, DO 07/28/2022 8:47:23 AM This report has been signed electronically. Number of Addenda: 0

## 2022-07-28 NOTE — Discharge Instructions (Signed)
?  Colonoscopy ?Discharge Instructions ? ?Read the instructions outlined below and refer to this sheet in the next few weeks. These discharge instructions provide you with general information on caring for yourself after you leave the hospital. Your doctor may also give you specific instructions. While your treatment has been planned according to the most current medical practices available, unavoidable complications occasionally occur.  ? ?ACTIVITY ?You may resume your regular activity, but move at a slower pace for the next 24 hours.  ?Take frequent rest periods for the next 24 hours.  ?Walking will help get rid of the air and reduce the bloated feeling in your belly (abdomen).  ?No driving for 24 hours (because of the medicine (anesthesia) used during the test).   ?Do not sign any important legal documents or operate any machinery for 24 hours (because of the anesthesia used during the test).  ?NUTRITION ?Drink plenty of fluids.  ?You may resume your normal diet as instructed by your doctor.  ?Begin with a light meal and progress to your normal diet. Heavy or fried foods are harder to digest and may make you feel sick to your stomach (nauseated).  ?Avoid alcoholic beverages for 24 hours or as instructed.  ?MEDICATIONS ?You may resume your normal medications unless your doctor tells you otherwise.  ?WHAT YOU CAN EXPECT TODAY ?Some feelings of bloating in the abdomen.  ?Passage of more gas than usual.  ?Spotting of blood in your stool or on the toilet paper.  ?IF YOU HAD POLYPS REMOVED DURING THE COLONOSCOPY: ?No aspirin products for 7 days or as instructed.  ?No alcohol for 7 days or as instructed.  ?Eat a soft diet for the next 24 hours.  ?FINDING OUT THE RESULTS OF YOUR TEST ?Not all test results are available during your visit. If your test results are not back during the visit, make an appointment with your caregiver to find out the results. Do not assume everything is normal if you have not heard from your  caregiver or the medical facility. It is important for you to follow up on all of your test results.  ?SEEK IMMEDIATE MEDICAL ATTENTION IF: ?You have more than a spotting of blood in your stool.  ?Your belly is swollen (abdominal distention).  ?You are nauseated or vomiting.  ?You have a temperature over 101.  ?You have abdominal pain or discomfort that is severe or gets worse throughout the day.  ? ?Your colonoscopy revealed 3 polyp(s) which I removed successfully. Await pathology results, my office will contact you. I recommend repeating colonoscopy in 5 years for surveillance purposes. You also have diverticulosis and internal hemorrhoids. I would recommend increasing fiber in your diet or adding OTC Benefiber/Metamucil. Be sure to drink at least 4 to 6 glasses of water daily. Follow-up with GI as needed. ? ? ? ?I hope you have a great rest of your week! ? ?Bunnie Lederman K. Shantelle Alles, D.O. ?Gastroenterology and Hepatology ?[facility] ? ?

## 2022-07-29 DIAGNOSIS — L400 Psoriasis vulgaris: Secondary | ICD-10-CM | POA: Diagnosis not present

## 2022-07-29 LAB — SURGICAL PATHOLOGY

## 2022-07-29 NOTE — Anesthesia Postprocedure Evaluation (Signed)
Anesthesia Post Note  Patient: Joyce [name]  Procedure(s) Performed: COLONOSCOPY WITH PROPOFOL POLYPECTOMY  Patient location during evaluation: Phase II Anesthesia Type: General Level of consciousness: awake Pain management: pain level controlled Vital Signs Assessment: post-procedure vital signs reviewed and stable Respiratory status: spontaneous breathing and respiratory function stable Cardiovascular status: blood pressure returned to baseline and stable Postop Assessment: no headache and no apparent nausea or vomiting Anesthetic complications: no Comments: Late entry   No notable events documented.   Last Vitals:  Vitals:   07/28/22 0710 07/28/22 0846  BP: 138/78 (!) 97/54  Pulse: 76 88  Resp:  12  Temp: 36.5 C 36.5 C  SpO2: 99% 96%    Last Pain:  Vitals:   07/28/22 0846  TempSrc: Oral  PainSc: 0-No pain                 Windell Norfolk

## 2022-08-01 DIAGNOSIS — M549 Dorsalgia, unspecified: Secondary | ICD-10-CM | POA: Diagnosis not present

## 2022-08-04 ENCOUNTER — Encounter (HOSPITAL_COMMUNITY): Payer: Self-pay | Admitting: Internal Medicine

## 2022-08-18 ENCOUNTER — Ambulatory Visit (HOSPITAL_COMMUNITY): Payer: Medicare Other

## 2022-08-18 ENCOUNTER — Other Ambulatory Visit (HOSPITAL_COMMUNITY)
Admission: RE | Admit: 2022-08-18 | Discharge: 2022-08-18 | Disposition: A | Discharge status: Home / Self Care | Payer: Medicare Other | Source: Ambulatory Visit | Attending: Adult Health | Admitting: Adult Health

## 2022-08-18 ENCOUNTER — Encounter: Payer: Self-pay | Admitting: Adult Health

## 2022-08-18 ENCOUNTER — Ambulatory Visit (INDEPENDENT_AMBULATORY_CARE_PROVIDER_SITE_OTHER): Payer: Medicare Other | Admitting: Adult Health

## 2022-08-18 VITALS — BP 128/89 | HR 83 | Ht 61.5 in | Wt 196.5 lb

## 2022-08-18 DIAGNOSIS — Z08 Encounter for follow-up examination after completed treatment for malignant neoplasm: Secondary | ICD-10-CM | POA: Insufficient documentation

## 2022-08-18 DIAGNOSIS — K649 Unspecified hemorrhoids: Secondary | ICD-10-CM

## 2022-08-18 DIAGNOSIS — Z90710 Acquired absence of both cervix and uterus: Secondary | ICD-10-CM | POA: Diagnosis present

## 2022-08-18 DIAGNOSIS — Z01419 Encounter for gynecological examination (general) (routine) without abnormal findings: Secondary | ICD-10-CM | POA: Diagnosis not present

## 2022-08-18 DIAGNOSIS — N3945 Continuous leakage: Secondary | ICD-10-CM

## 2022-08-18 DIAGNOSIS — N3941 Urge incontinence: Secondary | ICD-10-CM | POA: Diagnosis not present

## 2022-08-18 DIAGNOSIS — Z8541 Personal history of malignant neoplasm of cervix uteri: Secondary | ICD-10-CM | POA: Diagnosis present

## 2022-08-18 DIAGNOSIS — Z1211 Encounter for screening for malignant neoplasm of colon: Secondary | ICD-10-CM | POA: Diagnosis not present

## 2022-08-18 DIAGNOSIS — Z1151 Encounter for screening for human papillomavirus (HPV): Secondary | ICD-10-CM | POA: Diagnosis not present

## 2022-08-18 LAB — POCT URINALYSIS DIPSTICK
Blood, UA: NEGATIVE
Glucose, UA: NEGATIVE
Ketones, UA: NEGATIVE
Leukocytes, UA: NEGATIVE
Nitrite, UA: NEGATIVE
Protein, UA: NEGATIVE

## 2022-08-18 LAB — HEMOCCULT GUIAC POC 1CARD (OFFICE): Fecal Occult Blood, POC: NEGATIVE

## 2022-08-18 MED ORDER — OXYBUTYNIN CHLORIDE ER 10 MG PO TB24: 10.0000 mg | ORAL_TABLET | Freq: Every day | ORAL | 6 refills | Status: AC

## 2022-08-18 NOTE — Progress Notes (Signed)
Patient ID: Joyce [name] [facility], female   DOB: 10-28-1955, 67 y.o.   MRN: [id number] History of Present Illness: Joyce [name] is a 67 year old white female, single, sp hysterectomy for cervical cancer years ago, in for well woman gyn exam and pap. She is complaining of leaking urine for about 5 months.  PCP is Joyce Schmidt NP   Current Medications, Allergies, Past Medical History, Past Surgical History, Family History and Social History were reviewed in [employer] record.     Review of Systems: Patient denies any headaches, hearing loss, fatigue, blurred vision, shortness of breath, chest pain, abdominal pain, problems with bowel movements,  or intercourse(not active). No joint pain or [name] swings.  See HPI for positives   Physical Exam:BP 128/89 (BP Location: Left Arm, Patient Position: Sitting, Cuff Size: Normal)   Pulse 83   Ht 5' 1.5" (1.562 m)   Wt 196 lb 8 oz (89.1 kg)   BMI 36.53 kg/m  urine dipstick is negative.  General:  Well developed, well nourished, no acute distress Skin:  Warm and dry Neck:  Midline trachea, normal thyroid, good ROM, no lymphadenopathy,no carotid bruits heard  Lungs; Clear to auscultation bilaterally Breast:  No dominant palpable mass, retraction, or nipple discharge Cardiovascular: Regular rate and rhythm Abdomen:  Soft, non tender, no hepatosplenomegaly Pelvic:  External genitalia is normal in appearance, no lesions.  The vagina is pale. Urethra has no lesions or masses. The cervix and uterus are absent, vaginal pap with HR HPV genotyping performed.  No adnexal masses or tenderness noted.Bladder is non tender, no masses felt. Rectal: Good sphincter tone, no polyps, + hemorrhoids felt.  Hemoccult negative. Extremities/musculoskeletal:  No swelling or varicosities noted, no clubbing or cyanosis Psych:  No [name] changes, alert and cooperative,seems happy AA is 0 Fall risk is low    08/18/2022   11:33 AM 06/05/2020   10:17 AM 01/16/2020     8:14 AM  Depression screen PHQ 2/9  Decreased Interest 1 0 0  Down, Depressed, Hopeless 0 0   PHQ - 2 Score 1 0 0  Altered sleeping 0 2   Tired, decreased energy 1 1   Change in appetite 1 3   Feeling bad or failure about yourself  0 1   Trouble concentrating 0 1   Moving slowly or fidgety/restless 1 1   Suicidal thoughts 0 0   PHQ-9 Score 4 9        08/18/2022   11:33 AM 06/05/2020   10:17 AM 05/02/2019    2:23 PM  GAD 7 : Generalized Anxiety Score  Nervous, Anxious, on Edge 0 1 0  Control/stop worrying 0 1 0  Worry too much - different things 1 1   Trouble relaxing 0 1   Restless 0 1   Easily annoyed or irritable 1 1   Afraid - awful might happen 0 0   Total GAD 7 Score 2 6       Upstream - 08/18/22 1150       Pregnancy Intention Screening   Does the patient want to become pregnant in the next year? N/A    Does the patient's partner want to become pregnant in the next year? N/A    Would the patient like to discuss contraceptive options today? N/A      Contraception Wrap Up   Current Method Female Sterilization   hyst   End Method Female Sterilization   hyst   Contraception Counseling Provided No  Examination chaperoned by Joyce Mood LPN   Impression and Plan: 1. Urge incontinence of urine - POCT Urinalysis Dipstick Will try oxybutynin  2. History of cervical cancer Pap sent  - Cytology - PAP( [place])  3. Encounter for well woman exam with routine gynecological exam Physical with PCP next year GYN exam in 2-3 years  Labs with PCP Has mammogram tomorrow at [facility] Colonoscopy per Gi  4. Vaginal Pap smear following hysterectomy for malignancy Pap sent  - Cytology - PAP( [place])  5. Continuous leakage of urine Will try oxybutynin Meds ordered this encounter  Medications   oxybutynin (DITROPAN-XL) 10 MG 24 hr tablet    Sig: Take 1 tablet (10 mg total) by mouth at bedtime.    Dispense:  30 tablet    Refill:  6    Order Specific  Question:   Supervising Provider    Answer:   Duane Lope H [[id number]]   Follow up in 3 months for ROS, if persists will get urology consult   6. Encounter for screening fecal occult blood testing Hemoccult was negative  - POCT occult blood stool  7. Hemorrhoids, unspecified hemorrhoid type Try preparation H or Anusol, TUCKS

## 2022-08-19 ENCOUNTER — Ambulatory Visit (HOSPITAL_COMMUNITY)
Admission: RE | Admit: 2022-08-19 | Discharge: 2022-08-19 | Disposition: A | Discharge status: Home / Self Care | Payer: Medicare Other | Source: Ambulatory Visit | Attending: Adult Health | Admitting: Adult Health

## 2022-08-19 ENCOUNTER — Encounter (HOSPITAL_COMMUNITY): Payer: Self-pay

## 2022-08-19 DIAGNOSIS — Z1231 Encounter for screening mammogram for malignant neoplasm of breast: Secondary | ICD-10-CM

## 2022-08-22 ENCOUNTER — Other Ambulatory Visit (HOSPITAL_COMMUNITY): Payer: Self-pay | Admitting: Adult Health

## 2022-08-22 DIAGNOSIS — R928 Other abnormal and inconclusive findings on diagnostic imaging of breast: Secondary | ICD-10-CM

## 2022-08-22 LAB — CYTOLOGY - PAP
Comment: NEGATIVE
Diagnosis: NEGATIVE
High risk HPV: NEGATIVE

## 2022-08-25 ENCOUNTER — Encounter (HOSPITAL_COMMUNITY): Payer: Self-pay

## 2022-08-25 ENCOUNTER — Ambulatory Visit (HOSPITAL_COMMUNITY)
Admission: RE | Admit: 2022-08-25 | Discharge: 2022-08-25 | Disposition: A | Discharge status: Home / Self Care | Payer: Medicare Other | Source: Ambulatory Visit | Attending: Adult Health | Admitting: Adult Health

## 2022-08-25 DIAGNOSIS — R922 Inconclusive mammogram: Secondary | ICD-10-CM | POA: Diagnosis not present

## 2022-08-25 DIAGNOSIS — R928 Other abnormal and inconclusive findings on diagnostic imaging of breast: Secondary | ICD-10-CM

## 2022-09-05 ENCOUNTER — Telehealth: Payer: Self-pay

## 2022-09-05 ENCOUNTER — Telehealth: Payer: Self-pay | Admitting: Adult Health

## 2022-09-05 NOTE — Telephone Encounter (Signed)
Pt states the medication proscribed must be to strong. She is unable to use the restroom. Please advise.

## 2022-09-05 NOTE — Telephone Encounter (Signed)
So stop ditropan and give me a call back in 3-4 days with update

## 2022-09-05 NOTE — Telephone Encounter (Signed)
Pt thinks Ditropan is to strong. She sits on the toilet for 30 minutes, trying to urinate. Pt has noticed this for about 10 days. Pt feels like bladder is full but urine don't want to come out. Please advise. Thanks! JSY

## 2022-09-05 NOTE — Telephone Encounter (Signed)
Left message @ 10:52 am. Can return call or send a mychart message. JSY

## 2022-09-21 DIAGNOSIS — L400 Psoriasis vulgaris: Secondary | ICD-10-CM | POA: Diagnosis not present

## 2022-09-21 DIAGNOSIS — I10 Essential (primary) hypertension: Secondary | ICD-10-CM | POA: Diagnosis not present

## 2022-10-26 NOTE — Telephone Encounter (Signed)
Close  

## 2022-11-16 ENCOUNTER — Ambulatory Visit: Payer: Medicare Other | Admitting: Adult Health

## 2022-11-17 ENCOUNTER — Ambulatory Visit: Payer: Medicare Other | Admitting: Adult Health

## 2022-12-10 DIAGNOSIS — M25551 Pain in right hip: Secondary | ICD-10-CM | POA: Diagnosis not present

## 2023-01-11 DIAGNOSIS — M549 Dorsalgia, unspecified: Secondary | ICD-10-CM | POA: Diagnosis not present

## 2023-02-01 DIAGNOSIS — L400 Psoriasis vulgaris: Secondary | ICD-10-CM | POA: Diagnosis not present

## 2023-09-20 ENCOUNTER — Other Ambulatory Visit (HOSPITAL_COMMUNITY): Payer: Self-pay | Admitting: Adult Health

## 2023-09-20 DIAGNOSIS — N63 Unspecified lump in unspecified breast: Secondary | ICD-10-CM

## 2023-09-28 ENCOUNTER — Encounter (HOSPITAL_COMMUNITY): Payer: Self-pay

## 2023-09-28 ENCOUNTER — Ambulatory Visit (HOSPITAL_COMMUNITY)
Admission: RE | Admit: 2023-09-28 | Discharge: 2023-09-28 | Disposition: A | Discharge status: Home / Self Care | Source: Ambulatory Visit | Attending: Adult Health | Admitting: Adult Health

## 2023-09-28 DIAGNOSIS — N641 Fat necrosis of breast: Secondary | ICD-10-CM | POA: Diagnosis not present

## 2023-09-28 DIAGNOSIS — N6315 Unspecified lump in the right breast, overlapping quadrants: Secondary | ICD-10-CM | POA: Insufficient documentation

## 2023-09-28 DIAGNOSIS — N63 Unspecified lump in unspecified breast: Secondary | ICD-10-CM

## 2023-09-29 ENCOUNTER — Ambulatory Visit: Payer: Self-pay | Admitting: Adult Health

## 2023-11-22 ENCOUNTER — Encounter: Payer: Self-pay | Admitting: Adult Health

## 2023-11-22 ENCOUNTER — Ambulatory Visit: Admitting: Adult Health

## 2023-11-22 ENCOUNTER — Other Ambulatory Visit (HOSPITAL_COMMUNITY)
Admission: RE | Admit: 2023-11-22 | Discharge: 2023-11-22 | Disposition: A | Discharge status: Home / Self Care | Source: Ambulatory Visit | Attending: Adult Health | Admitting: Adult Health

## 2023-11-22 VITALS — BP 145/88 | HR 83 | Ht 62.0 in | Wt 191.5 lb

## 2023-11-22 DIAGNOSIS — Z08 Encounter for follow-up examination after completed treatment for malignant neoplasm: Secondary | ICD-10-CM | POA: Diagnosis present

## 2023-11-22 DIAGNOSIS — Z1331 Encounter for screening for depression: Secondary | ICD-10-CM | POA: Diagnosis not present

## 2023-11-22 DIAGNOSIS — Z90710 Acquired absence of both cervix and uterus: Secondary | ICD-10-CM | POA: Diagnosis not present

## 2023-11-22 DIAGNOSIS — Z01419 Encounter for gynecological examination (general) (routine) without abnormal findings: Secondary | ICD-10-CM | POA: Diagnosis not present

## 2023-11-22 DIAGNOSIS — Z8541 Personal history of malignant neoplasm of cervix uteri: Secondary | ICD-10-CM | POA: Diagnosis not present

## 2023-11-22 DIAGNOSIS — Z1272 Encounter for screening for malignant neoplasm of vagina: Secondary | ICD-10-CM | POA: Insufficient documentation

## 2023-11-22 DIAGNOSIS — N3941 Urge incontinence: Secondary | ICD-10-CM | POA: Diagnosis not present

## 2023-11-22 DIAGNOSIS — Z1151 Encounter for screening for human papillomavirus (HPV): Secondary | ICD-10-CM | POA: Insufficient documentation

## 2023-11-22 DIAGNOSIS — I10 Essential (primary) hypertension: Secondary | ICD-10-CM

## 2023-11-22 DIAGNOSIS — Z1211 Encounter for screening for malignant neoplasm of colon: Secondary | ICD-10-CM

## 2023-11-22 LAB — HEMOCCULT GUIAC POC 1CARD (OFFICE): Fecal Occult Blood, POC: NEGATIVE

## 2023-11-22 NOTE — Progress Notes (Signed)
 Patient ID: Joyce [name] [facility], female   DOB: 05/29/1955, 68 y.o.   MRN: [id number] History of Present Illness: Elycia is a 68 year old white female,single, sp hysterectomy for cervical cancer in for well woman gyn exam and pap.     Component Value Date/Time   DIAGPAP  08/18/2022 1214    - Negative for intraepithelial lesion or malignancy (NILM)   DIAGPAP  05/22/2017 0000    NEGATIVE FOR INTRAEPITHELIAL LESIONS OR MALIGNANCY.   HPVHIGH Negative 08/18/2022 1214   ADEQPAP Satisfactory for evaluation. 08/18/2022 1214   ADEQPAP Satisfactory for evaluation. 05/22/2017 0000   PCP is MARLA Cassette NP     Current Medications, Allergies, Past Medical History, Past Surgical History, Family History and Social History were reviewed in [employer] record.     Review of Systems: Patient denies any headaches, hearing loss, fatigue, blurred vision, shortness of breath, chest pain, abdominal pain, problems with bowel movements,  or intercourse(not active). No joint pain or mood swings.  Has urinary leakage if has go go   Physical Exam:BP (!) 145/88 (BP Location: Left Arm, Patient Position: Sitting, Cuff Size: Large)   Pulse 83   Ht 5' 2 (1.575 m)   Wt 191 lb 8 oz (86.9 kg)   BMI 35.03 kg/m   General:  Well developed, well nourished, no acute distress Skin:  Warm and dry Neck:  Midline trachea, normal thyroid , good ROM, no lymphadenopathy,no carotid bruits heard  Lungs; Clear to auscultation bilaterally Breast:  No dominant palpable mass, retraction, or nipple discharge Cardiovascular: Regular rate and rhythm Abdomen:  Soft, non tender, no hepatosplenomegaly Pelvic:  External genitalia is normal in appearance, no lesions.  The vagina is pale,no lesions seen. Urethra has no lesions or masses. The cervix and uterus is absent, vaginal pap obtained with HPV genotyping.  No adnexal masses or tenderness noted.Bladder is non tender, no masses felt. Rectal: Good sphincter tone, no polyps, +  hemorrhoids felt.  Hemoccult negative. Extremities/musculoskeletal:  No swelling or varicosities noted, no clubbing or cyanosis Psych:  No mood changes, alert and cooperative,seems happy AA is 0 Fall risk is low    11/22/2023   11:37 AM 08/18/2022   11:33 AM 06/05/2020   10:17 AM  Depression screen PHQ 2/9  Decreased Interest 0 1 0  Down, Depressed, Hopeless 0 0 0  PHQ - 2 Score 0 1 0  Altered sleeping 0 0 2  Tired, decreased energy 1 1 1   Change in appetite 0 1 3  Feeling bad or failure about yourself  0 0 1  Trouble concentrating 0 0 1  Moving slowly or fidgety/restless 0 1 1  Suicidal thoughts 0 0 0  PHQ-9 Score 1 4 9        11/22/2023   11:37 AM 08/18/2022   11:33 AM 06/05/2020   10:17 AM 05/02/2019    2:23 PM  GAD 7 : Generalized Anxiety Score  Nervous, Anxious, on Edge 1 0 1 0  Control/stop worrying 1 0 1 0  Worry too much - different things 1 1 1    Trouble relaxing 1 0 1   Restless 1 0 1   Easily annoyed or irritable 0 1 1   Afraid - awful might happen 0 0 0   Total GAD 7 Score 5 2 6      Upstream - 11/22/23 1134       Pregnancy Intention Screening   Does the patient want to become pregnant in the next year? N/A  Does the patient's partner want to become pregnant in the next year? N/A    Would the patient like to discuss contraceptive options today? N/A      Contraception Wrap Up   Current Method Female Sterilization   hyst   End Method Female Sterilization   hyst   Contraception Counseling Provided No           Examination chaperoned by Clarita Salt LPN   Impression and plan: 1. Vaginal Pap smear following hysterectomy for malignancy Pap sent Pap in 1 year  - Cytology - PAP( [place])  2. History of cervical cancer Pap sent - Cytology - PAP( [place])  3. Encounter for well woman exam with routine gynecological exam (Primary) Pap and physical in 1 year Labs with PCP Colonoscopy per GI Mammogram 6/5 showed fat necrosis, no malignancy   4.  Encounter for screening fecal occult blood testing Hemoccult was negative  - POCT occult blood stool  5. Urge incontinence of urine Declines meds or urology consult, wears a pad  6. Primary hypertension Take meds and follow up with PCP

## 2023-11-28 LAB — CYTOLOGY - PAP
Comment: NEGATIVE
Diagnosis: NEGATIVE
High risk HPV: NEGATIVE

## 2023-11-29 ENCOUNTER — Ambulatory Visit: Payer: Self-pay | Admitting: Adult Health

## 2024-02-07 ENCOUNTER — Encounter (INDEPENDENT_AMBULATORY_CARE_PROVIDER_SITE_OTHER): Payer: Self-pay | Admitting: Gastroenterology
